# Patient Record
Sex: Male | Born: 1935 | Race: White | Hispanic: No | Smoking: Former smoker
Health system: Southern US, Community
[De-identification: ages and names within clinical notes are randomized; demographics above are authoritative.]

## PROBLEM LIST (undated history)

## (undated) DIAGNOSIS — Z973 Presence of spectacles and contact lenses: Secondary | ICD-10-CM

## (undated) DIAGNOSIS — M48061 Spinal stenosis, lumbar region without neurogenic claudication: Secondary | ICD-10-CM

## (undated) DIAGNOSIS — E875 Hyperkalemia: Secondary | ICD-10-CM

## (undated) DIAGNOSIS — M79603 Pain in arm, unspecified: Secondary | ICD-10-CM

## (undated) DIAGNOSIS — K449 Diaphragmatic hernia without obstruction or gangrene: Secondary | ICD-10-CM

## (undated) DIAGNOSIS — M549 Dorsalgia, unspecified: Secondary | ICD-10-CM

## (undated) DIAGNOSIS — I2510 Atherosclerotic heart disease of native coronary artery without angina pectoris: Secondary | ICD-10-CM

## (undated) DIAGNOSIS — Z8551 Personal history of malignant neoplasm of bladder: Secondary | ICD-10-CM

## (undated) DIAGNOSIS — G4700 Insomnia, unspecified: Secondary | ICD-10-CM

## (undated) DIAGNOSIS — K219 Gastro-esophageal reflux disease without esophagitis: Secondary | ICD-10-CM

## (undated) DIAGNOSIS — E785 Hyperlipidemia, unspecified: Secondary | ICD-10-CM

## (undated) DIAGNOSIS — M1990 Unspecified osteoarthritis, unspecified site: Secondary | ICD-10-CM

## (undated) DIAGNOSIS — R001 Bradycardia, unspecified: Secondary | ICD-10-CM

## (undated) DIAGNOSIS — M419 Scoliosis, unspecified: Secondary | ICD-10-CM

## (undated) DIAGNOSIS — I10 Essential (primary) hypertension: Secondary | ICD-10-CM

## (undated) DIAGNOSIS — R609 Edema, unspecified: Secondary | ICD-10-CM

## (undated) DIAGNOSIS — I700 Atherosclerosis of aorta: Secondary | ICD-10-CM

## (undated) DIAGNOSIS — R011 Cardiac murmur, unspecified: Secondary | ICD-10-CM

## (undated) DIAGNOSIS — M5136 Other intervertebral disc degeneration, lumbar region: Secondary | ICD-10-CM

## (undated) DIAGNOSIS — F419 Anxiety disorder, unspecified: Secondary | ICD-10-CM

## (undated) DIAGNOSIS — E876 Hypokalemia: Secondary | ICD-10-CM

## (undated) DIAGNOSIS — I4819 Other persistent atrial fibrillation: Secondary | ICD-10-CM

## (undated) DIAGNOSIS — G629 Polyneuropathy, unspecified: Secondary | ICD-10-CM

## (undated) DIAGNOSIS — M7070 Other bursitis of hip, unspecified hip: Secondary | ICD-10-CM

## (undated) DIAGNOSIS — Z955 Presence of coronary angioplasty implant and graft: Secondary | ICD-10-CM

## (undated) DIAGNOSIS — I6529 Occlusion and stenosis of unspecified carotid artery: Secondary | ICD-10-CM

## (undated) DIAGNOSIS — I340 Nonrheumatic mitral (valve) insufficiency: Secondary | ICD-10-CM

## (undated) HISTORY — DX: Unspecified osteoarthritis, unspecified site: M19.90

## (undated) HISTORY — PX: APPENDECTOMY: SHX54

## (undated) HISTORY — DX: Cardiac murmur, unspecified: R01.1

## (undated) HISTORY — PX: CORONARY ARTERY BYPASS GRAFT: SHX141

## (undated) HISTORY — PX: CAROTID STENT: SHX1301

## (undated) HISTORY — PX: COLONOSCOPY: SHX174

---
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
## 2004-08-19 DIAGNOSIS — I219 Acute myocardial infarction, unspecified: Secondary | ICD-10-CM

## 2004-08-19 HISTORY — PX: CORONARY ANGIOPLASTY WITH STENT PLACEMENT: SHX49

## 2004-08-19 HISTORY — DX: Acute myocardial infarction, unspecified: I21.9

## 2010-08-19 HISTORY — PX: CARDIAC CATHETERIZATION: SHX172

## 2010-08-19 HISTORY — PX: CORONARY ANGIOPLASTY: SHX604

## 2010-12-25 DIAGNOSIS — I2510 Atherosclerotic heart disease of native coronary artery without angina pectoris: Secondary | ICD-10-CM

## 2010-12-25 HISTORY — DX: Atherosclerotic heart disease of native coronary artery without angina pectoris: I25.10

## 2011-01-22 DIAGNOSIS — R001 Bradycardia, unspecified: Secondary | ICD-10-CM | POA: Insufficient documentation

## 2011-01-22 HISTORY — DX: Bradycardia, unspecified: R00.1

## 2011-05-08 DIAGNOSIS — I6529 Occlusion and stenosis of unspecified carotid artery: Secondary | ICD-10-CM

## 2011-05-08 DIAGNOSIS — Z955 Presence of coronary angioplasty implant and graft: Secondary | ICD-10-CM | POA: Insufficient documentation

## 2011-05-08 HISTORY — DX: Occlusion and stenosis of unspecified carotid artery: I65.29

## 2011-05-08 HISTORY — DX: Presence of coronary angioplasty implant and graft: Z95.5

## 2012-04-28 DIAGNOSIS — G4700 Insomnia, unspecified: Secondary | ICD-10-CM

## 2012-04-28 DIAGNOSIS — M549 Dorsalgia, unspecified: Secondary | ICD-10-CM

## 2012-04-28 DIAGNOSIS — G629 Polyneuropathy, unspecified: Secondary | ICD-10-CM | POA: Insufficient documentation

## 2012-04-28 HISTORY — DX: Polyneuropathy, unspecified: G62.9

## 2012-04-28 HISTORY — DX: Dorsalgia, unspecified: M54.9

## 2012-04-28 HISTORY — DX: Insomnia, unspecified: G47.00

## 2013-03-23 DIAGNOSIS — S27809A Unspecified injury of diaphragm, initial encounter: Secondary | ICD-10-CM

## 2013-03-23 DIAGNOSIS — M79603 Pain in arm, unspecified: Secondary | ICD-10-CM

## 2013-03-23 DIAGNOSIS — K449 Diaphragmatic hernia without obstruction or gangrene: Secondary | ICD-10-CM | POA: Insufficient documentation

## 2013-03-23 HISTORY — DX: Pain in arm, unspecified: M79.603

## 2013-03-23 HISTORY — DX: Unspecified injury of diaphragm, initial encounter: S27.809A

## 2013-05-04 DIAGNOSIS — I340 Nonrheumatic mitral (valve) insufficiency: Secondary | ICD-10-CM

## 2013-05-04 HISTORY — DX: Nonrheumatic mitral (valve) insufficiency: I34.0

## 2013-09-28 DIAGNOSIS — E876 Hypokalemia: Secondary | ICD-10-CM | POA: Insufficient documentation

## 2013-09-28 HISTORY — DX: Hypokalemia: E87.6

## 2013-10-18 HISTORY — PX: HERNIA REPAIR: SHX51

## 2015-09-29 DIAGNOSIS — Z8551 Personal history of malignant neoplasm of bladder: Secondary | ICD-10-CM

## 2015-09-29 HISTORY — DX: Personal history of malignant neoplasm of bladder: Z85.51

## 2017-06-09 DIAGNOSIS — R609 Edema, unspecified: Secondary | ICD-10-CM | POA: Insufficient documentation

## 2017-06-09 HISTORY — DX: Edema, unspecified: R60.9

## 2019-06-01 DIAGNOSIS — M7070 Other bursitis of hip, unspecified hip: Secondary | ICD-10-CM

## 2019-06-01 HISTORY — DX: Other bursitis of hip, unspecified hip: M70.70

## 2019-06-03 ENCOUNTER — Other Ambulatory Visit: Payer: Self-pay | Admitting: Orthopedic Surgery

## 2019-06-03 DIAGNOSIS — M7072 Other bursitis of hip, left hip: Secondary | ICD-10-CM

## 2019-06-08 ENCOUNTER — Telehealth: Payer: Self-pay | Admitting: Nurse Practitioner

## 2019-06-08 NOTE — Telephone Encounter (Signed)
Phone call to patient to verify medication list and allergies for myelogram procedure, spoke with wife. Informed her he will not need to hold any medications for this procedure.  Pre and post procedure instructions reviewed with wife, she verbalized understanding.

## 2019-06-09 ENCOUNTER — Ambulatory Visit
Admission: RE | Admit: 2019-06-09 | Discharge: 2019-06-09 | Disposition: A | Discharge status: Home / Self Care | Payer: Medicare HMO | Source: Ambulatory Visit | Attending: Orthopedic Surgery | Admitting: Orthopedic Surgery

## 2019-06-09 ENCOUNTER — Other Ambulatory Visit: Payer: Self-pay | Admitting: Orthopedic Surgery

## 2019-06-09 DIAGNOSIS — M7072 Other bursitis of hip, left hip: Secondary | ICD-10-CM

## 2019-06-09 DIAGNOSIS — I77811 Abdominal aortic ectasia: Secondary | ICD-10-CM

## 2019-06-09 DIAGNOSIS — M48061 Spinal stenosis, lumbar region without neurogenic claudication: Secondary | ICD-10-CM

## 2019-06-09 HISTORY — DX: Abdominal aortic ectasia: I77.811

## 2019-06-09 MED ORDER — IOPAMIDOL (ISOVUE-M 200) INJECTION 41%
15.0000 mL | Freq: Once | INTRAMUSCULAR | Status: AC
  Administered 2019-06-09: 15 mL via INTRATHECAL

## 2019-06-09 MED ORDER — MEPERIDINE HCL 100 MG/ML IJ SOLN
50.0000 mg | Freq: Once | INTRAMUSCULAR | Status: AC
  Administered 2019-06-09: 50 mg via INTRAMUSCULAR

## 2019-06-09 MED ORDER — ONDANSETRON HCL 4 MG/2ML IJ SOLN
4.0000 mg | Freq: Once | INTRAMUSCULAR | Status: AC
  Administered 2019-06-09: 4 mg via INTRAMUSCULAR

## 2019-06-09 NOTE — Discharge Instructions (Signed)

## 2019-06-10 ENCOUNTER — Other Ambulatory Visit: Payer: Self-pay

## 2019-06-16 ENCOUNTER — Encounter: Payer: Self-pay | Admitting: *Deleted

## 2019-06-16 ENCOUNTER — Other Ambulatory Visit: Payer: Self-pay

## 2019-06-16 ENCOUNTER — Other Ambulatory Visit: Payer: Self-pay | Admitting: *Deleted

## 2019-06-16 ENCOUNTER — Ambulatory Visit (INDEPENDENT_AMBULATORY_CARE_PROVIDER_SITE_OTHER): Payer: Medicare HMO | Admitting: Cardiology

## 2019-06-16 ENCOUNTER — Telehealth (HOSPITAL_COMMUNITY): Payer: Self-pay

## 2019-06-16 VITALS — BP 118/60 | HR 78 | Ht 69.0 in | Wt 163.0 lb

## 2019-06-16 DIAGNOSIS — E785 Hyperlipidemia, unspecified: Secondary | ICD-10-CM

## 2019-06-16 DIAGNOSIS — K219 Gastro-esophageal reflux disease without esophagitis: Secondary | ICD-10-CM | POA: Insufficient documentation

## 2019-06-16 DIAGNOSIS — I2510 Atherosclerotic heart disease of native coronary artery without angina pectoris: Secondary | ICD-10-CM | POA: Diagnosis not present

## 2019-06-16 DIAGNOSIS — I10 Essential (primary) hypertension: Secondary | ICD-10-CM | POA: Diagnosis not present

## 2019-06-16 DIAGNOSIS — I4510 Unspecified right bundle-branch block: Secondary | ICD-10-CM

## 2019-06-16 DIAGNOSIS — Z01810 Encounter for preprocedural cardiovascular examination: Secondary | ICD-10-CM | POA: Insufficient documentation

## 2019-06-16 DIAGNOSIS — E782 Mixed hyperlipidemia: Secondary | ICD-10-CM

## 2019-06-16 DIAGNOSIS — I4819 Other persistent atrial fibrillation: Secondary | ICD-10-CM

## 2019-06-16 HISTORY — DX: Unspecified right bundle-branch block: I45.10

## 2019-06-16 HISTORY — DX: Hyperlipidemia, unspecified: E78.5

## 2019-06-16 HISTORY — DX: Gastro-esophageal reflux disease without esophagitis: K21.9

## 2019-06-16 HISTORY — DX: Essential (primary) hypertension: I10

## 2019-06-16 NOTE — Telephone Encounter (Signed)
Attempted to reach the patient with instructions for his test tomorrow. Instructions left on the patient's answering machine. Asked to call back with any questions. S.Phoebe Marter EMTP

## 2019-06-16 NOTE — Progress Notes (Addendum)
Cardiology Office Note:    Date:  06/16/2019   ID:  Richard Bowen, DOB [birth year], MRN [id number]  PCP:  Patient, No Pcp Per  Cardiologist:  Garwin Brothersajan R Undine Nealis, MD   Referring MD: No ref. provider found    ASSESSMENT:    1. Preoperative cardiovascular examination   2. Mixed hyperlipidemia   3. Essential hypertension   4. Coronary artery disease involving native coronary artery of native heart without angina pectoris   5. Persistent atrial fibrillation (HCC)    PLAN:    In order of problems listed above:  1. Preoperative cardiovascular examination in the stratification: I discussed my findings with the patient at extensive length and secondary prevention stressed.  Importance of compliance with diet and medication stressed and he vocalized understanding. 2. Essential hypertension: Blood pressure is stable 3. Mixed dyslipidemia: Diet was discussed and lipids are followed by his primary care physician. 4. Atrial fibrillation: Patient mentions to me that he has been told that his heart rate is irregular.  His heart rate is well controlled.  In view of his unstable gait and impending spinal surgery I would not recommend anticoagulation at this time as the risks outweigh the benefits.  I discussed this with him at length and questions were answered to satisfaction. 5. In view of the above patient will undergo Lexiscan sestamibi.  If this test is negative then he is not at high risk for coronary events during the aforementioned surgery.  Meticulous hemodynamic monitoring will further reduce the risk of coronary events.  Echocardiogram will be done to assess murmur heard on auscultation.Patient will be seen in follow-up appointment in 2 months or earlier if the patient has any concerns.  Addendum: Results of stress test and echocardiogram are now available.  Stress test is abnormal but did not reveal any evidence of ischemia.  In view of review of these tests it will be appropriate to deem this  patient has moderate risk for intermediate risk for coronary events.  Meticulous hemodynamic monitoring will further reduce the risk of coronary events.  Please do not hesitate to communicate with us if there are any questions in his cardiovascular management. Signed Dr. Glean Hessajan Juley Giovanetti 06/18/2019    Medication Adjustments/Labs and Tests Ordered: Current medicines are reviewed at length with the patient today.  Concerns regarding medicines are outlined above.  Orders Placed This Encounter  Procedures  . EKG 12-Lead   No orders of the defined types were placed in this encounter.    History of Present Illness:    Richard Bowen is a 85 y.o. male who is being seen today for the evaluation of preoperative cardiovascular evaluation at the request of orthopedic surgery.  Patient is a pleasant 85 year old male.  He is a poor historian.  He tells me that he had coronary stenting more than 10 years ago.  He does not remember any details.  He has history of essential hypertension and dyslipidemia.  He is contemplating orthopedic surgery and is ambulatory.  He has significant issues and nerve compression-like symptoms for which surgery is deemed necessary.  He denies any chest pain orthopnea or PND.  At the time of my evaluation, the patient is alert awake oriented and in no distress.  Past Medical History:  Diagnosis Date  . Arm pain 03/23/2013  . Arthritis   . Back pain 04/28/2012  . Carotid atherosclerosis 05/08/2011  . Coronary artery disease with history of myocardial infarction without history of CABG 12/25/2010   Coronary Artery Disease  .  GERD (gastroesophageal reflux disease) 06/16/2019  . Heart murmur   . History of bladder cancer 09/29/2015  . Hyperlipidemia 06/16/2019  . Hypertension 06/16/2019  . Hypokalemia 09/28/2013  . Insomnia 04/28/2012  . Ischial bursitis 06/01/2019  . Mitral insufficiency 05/04/2013  . Neuropathy 04/28/2012  . Peripheral edema 06/09/2017  . Rupture of diaphragm  03/23/2013  . S/P coronary artery stent placement 05/08/2011  . Sinus bradycardia 01/22/2011     Current Medications: Current Meds  Medication Sig  . ALPRAZolam (XANAX) 0.5 MG tablet Take 0.5 mg by mouth at bedtime as needed for anxiety.  Marland Kitchen aspirin 81 MG chewable tablet Chew by mouth daily.  . benazepril (LOTENSIN) 20 MG tablet Take 20 mg by mouth daily.  . furosemide (LASIX) 20 MG tablet Take 20 mg by mouth daily.  . potassium chloride (KLOR-CON) 10 MEQ tablet Take 10 mEq by mouth 2 (two) times daily.  . pravastatin (PRAVACHOL) 20 MG tablet Take 20 mg by mouth daily.     Allergies:   Rosuvastatin   Social History   Socioeconomic History  . Marital status: Unknown    Spouse name: Not on file  . Number of children: Not on file  . Years of education: Not on file  . Highest education level: Not on file  Occupational History  . Not on file  Social Needs  . Financial resource strain: Not on file  . Food insecurity    Worry: Not on file    Inability: Not on file  . Transportation needs    Medical: Not on file    Non-medical: Not on file  Tobacco Use  . Smoking status: Former Smoker    Quit date: 07/09/1965    Years since quitting: 53.9  . Smokeless tobacco: Never Used  Substance and Sexual Activity  . Alcohol use: Not Currently  . Drug use: Not on file  . Sexual activity: Not on file  Lifestyle  . Physical activity    Days per week: Not on file    Minutes per session: Not on file  . Stress: Not on file  Relationships  . Social Musician on phone: Not on file    Gets together: Not on file    Attends religious service: Not on file    Active member of club or organization: Not on file    Attends meetings of clubs or organizations: Not on file    Relationship status: Not on file  Other Topics Concern  . Not on file  Social History Narrative  . Not on file     Family History: The patient's family history includes Cancer in his mother; Diabetes in his  father; Early death in his father, mother, and sister; Stroke in his father.  ROS:   Please see the history of present illness.    All other systems reviewed and are negative.  EKGs/Labs/Other Studies Reviewed:    The following studies were reviewed today: EKG revealed atrial fibrillation with well-controlled ventricular rate   Recent Labs: No results found for requested labs within last 8760 hours.  Recent Lipid Panel No results found for: CHOL, TRIG, HDL, CHOLHDL, VLDL, LDLCALC, LDLDIRECT  Physical Exam:    VS:  BP 118/60 (BP Location: Left Arm, Patient Position: Sitting, Cuff Size: Normal)   Pulse 78   Ht 5\' 9"  (1.753 m)   Wt 163 lb (73.9 kg)   SpO2 97%   BMI 24.07 kg/m     Wt Readings from Last 3  Encounters:  06/16/19 163 lb (73.9 kg)     GEN: Patient is in no acute distress HEENT: Normal NECK: No JVD; No carotid bruits LYMPHATICS: No lymphadenopathy CARDIAC: S1 S2 regular, 2/6 systolic murmur at the apex. RESPIRATORY:  Clear to auscultation without rales, wheezing or rhonchi  ABDOMEN: Soft, non-tender, non-distended MUSCULOSKELETAL:  No edema; No deformity  SKIN: Warm and dry NEUROLOGIC:  Alert and oriented x 3 PSYCHIATRIC:  Normal affect    Signed, Jenean Lindau, MD  06/16/2019 9:38 AM    [facility]

## 2019-06-16 NOTE — Addendum Note (Signed)
Addended by: Beckey Rutter on: 06/16/2019 09:46 AM   Modules accepted: Orders

## 2019-06-16 NOTE — Patient Instructions (Signed)
Medication Instructions:  Your physician recommends that you continue on your current medications as directed. Please refer to the Current Medication list given to you today.  If you need a refill on your cardiac medications before your next appointment, please call your pharmacy.   Lab work: NONE If you have labs (blood work) drawn today and your tests are completely normal, you will receive your results only by: . MyChart Message (if you have MyChart) OR . A paper copy in the mail If you have any lab test that is abnormal or we need to change your treatment, we will call you to review the results.  Testing/Procedures: You had an EKG performed today.  Your physician has requested that you have an echocardiogram. Echocardiography is a painless test that uses sound waves to create images of your heart. It provides your doctor with information about the size and shape of your heart and how well your heart's chambers and valves are working. This procedure takes approximately one hour. There are no restrictions for this procedure.  Your physician has requested that you have a lexiscan myoview. For further information please visit [email or web address]. Please follow instruction sheet, as given.    Follow-Up: At [facility], you and your health needs are our priority.  As part of our continuing mission to provide you with exceptional heart care, we have created designated Provider Care Teams.  These Care Teams include your primary Cardiologist (physician) and Advanced Practice Providers (APPs -  Physician Assistants and Nurse Practitioners) who all work together to provide you with the care you need, when you need it. You will need a follow up appointment in 4 months.   Any Other Special Instructions Will Be Listed Below  Regadenoson injection What is this medicine? REGADENOSON is used to test the heart for coronary artery disease. It is used in patients who can not exercise for their stress  test. This medicine may be used for other purposes; ask your health care provider or pharmacist if you have questions. COMMON BRAND NAME(S): Lexiscan What should I tell my health care provider before I take this medicine? They need to know if you have any of these conditions:  heart problems  lung or breathing disease, like asthma or COPD  an unusual or allergic reaction to regadenoson, other medicines, foods, dyes, or preservatives  pregnant or trying to get pregnant  breast-feeding How should I use this medicine? This medicine is for injection into a vein. It is given by a health care professional in a hospital or clinic setting. Talk to your pediatrician regarding the use of this medicine in children. Special care may be needed. Overdosage: If you think you have taken too much of this medicine contact a poison control center or emergency room at once. NOTE: This medicine is only for you. Do not share this medicine with others. What if I miss a dose? This does not apply. What may interact with this medicine?  caffeine  dipyridamole  guarana  theophylline This list may not describe all possible interactions. Give your health care provider a list of all the medicines, herbs, non-prescription drugs, or dietary supplements you use. Also tell them if you smoke, drink alcohol, or use illegal drugs. Some items may interact with your medicine. What should I watch for while using this medicine? Your condition will be monitored carefully while you are receiving this medicine. Do not take medicines, foods, or drinks with caffeine (like coffee, tea, or colas) for at least 12 hours   before your test. If you do not know if something contains caffeine, ask your health care professional. What side effects may I notice from receiving this medicine? Side effects that you should report to your doctor or health care professional as soon as possible:  allergic reactions like skin rash, itching or  hives, swelling of the face, lips, or tongue  breathing problems  chest pain, tightness or palpitations  severe headache Side effects that usually do not require medical attention (report to your doctor or health care professional if they continue or are bothersome):  flushing  headache  irritation or pain at site where injected  nausea, vomiting This list may not describe all possible side effects. Call your doctor for medical advice about side effects. You may report side effects to FDA at 1-800-FDA-1088. Where should I keep my medicine? This drug is given in a hospital or clinic and will not be stored at home. NOTE: This sheet is a summary. It may not cover all possible information. If you have questions about this medicine, talk to your doctor, pharmacist, or health care provider.  2020 Elsevier/Gold Standard (2008-04-04 15:08:13)  Cardiac Nuclear Scan A cardiac nuclear scan is a test that is done to check the flow of blood to your heart. It is done when you are resting and when you are exercising. The test looks for problems such as:  Not enough blood reaching a portion of the heart.  The heart muscle not working as it should. You may need this test if:  You have heart disease.  You have had lab results that are not normal.  You have had heart surgery or a balloon procedure to open up blocked arteries (angioplasty).  You have chest pain.  You have shortness of breath. In this test, a special dye (tracer) is put into your bloodstream. The tracer will travel to your heart. A camera will then take pictures of your heart to see how the tracer moves through your heart. This test is usually done at a hospital and takes 2-4 hours. Tell a doctor about:  Any allergies you have.  All medicines you are taking, including vitamins, herbs, eye drops, creams, and over-the-counter medicines.  Any problems you or family members have had with anesthetic medicines.  Any blood  disorders you have.  Any surgeries you have had.  Any medical conditions you have.  Whether you are pregnant or may be pregnant. What are the risks? Generally, this is a safe test. However, problems may occur, such as:  Serious chest pain and heart attack. This is only a risk if the stress portion of the test is done.  Rapid heartbeat.  A feeling of warmth in your chest. This feeling usually does not last long.  Allergic reaction to the tracer. What happens before the test?  Ask your doctor about changing or stopping your normal medicines. This is important.  Follow instructions from your doctor about what you cannot eat or drink.  Remove your jewelry on the day of the test. What happens during the test?  An IV tube will be inserted into one of your veins.  Your doctor will give you a small amount of tracer through the IV tube.  You will wait for 20-40 minutes while the tracer moves through your bloodstream.  Your heart will be monitored with an electrocardiogram (ECG).  You will lie down on an exam table.  Pictures of your heart will be taken for about 15-20 minutes.  You may   also have a stress test. For this test, one of these things may be done: ? You will be asked to exercise on a treadmill or a stationary bike. ? You will be given medicines that will make your heart work harder. This is done if you are unable to exercise.  When blood flow to your heart has peaked, a tracer will again be given through the IV tube.  After 20-40 minutes, you will get back on the exam table. More pictures will be taken of your heart.  Depending on the tracer that is used, more pictures may need to be taken 3-4 hours later.  Your IV tube will be removed when the test is over. The test may vary among doctors and hospitals. What happens after the test?  Ask your doctor: ? Whether you can return to your normal schedule, including diet, activities, and medicines. ? Whether you should  drink more fluids. This will help to remove the tracer from your body. Drink enough fluid to keep your pee (urine) pale yellow.  Ask your doctor, or the department that is doing the test: ? When will my results be ready? ? How will I get my results? Summary  A cardiac nuclear scan is a test that is done to check the flow of blood to your heart.  Tell your doctor whether you are pregnant or may be pregnant.  Before the test, ask your doctor about changing or stopping your normal medicines. This is important.  Ask your doctor whether you can return to your normal activities. You may be asked to drink more fluids. This information is not intended to replace advice given to you by your health care provider. Make sure you discuss any questions you have with your health care provider. Document Released: 01/19/2018 Document Revised: 11/25/2018 Document Reviewed: 01/19/2018 Elsevier Patient Education  2020 Elsevier Inc.  Echocardiogram An echocardiogram is a procedure that uses painless sound waves (ultrasound) to produce an image of the heart. Images from an echocardiogram can provide important information about:  Signs of coronary artery disease (CAD).  Aneurysm detection. An aneurysm is a weak or damaged part of an artery wall that bulges out from the normal force of blood pumping through the body.  Heart size and shape. Changes in the size or shape of the heart can be associated with certain conditions, including heart failure, aneurysm, and CAD.  Heart muscle function.  Heart valve function.  Signs of a past heart attack.  Fluid buildup around the heart.  Thickening of the heart muscle.  A tumor or infectious growth around the heart valves. Tell a health care provider about:  Any allergies you have.  All medicines you are taking, including vitamins, herbs, eye drops, creams, and over-the-counter medicines.  Any blood disorders you have.  Any surgeries you have had.  Any  medical conditions you have.  Whether you are pregnant or may be pregnant. What are the risks? Generally, this is a safe procedure. However, problems may occur, including:  Allergic reaction to dye (contrast) that may be used during the procedure. What happens before the procedure? No specific preparation is needed. You may eat and drink normally. What happens during the procedure?   An IV tube may be inserted into one of your veins.  You may receive contrast through this tube. A contrast is an injection that improves the quality of the pictures from your heart.  A gel will be applied to your chest.  A wand-like tool (transducer)   will be moved over your chest. The gel will help to transmit the sound waves from the transducer.  The sound waves will harmlessly bounce off of your heart to allow the heart images to be captured in real-time motion. The images will be recorded on a computer. The procedure may vary among health care providers and hospitals. What happens after the procedure?  You may return to your normal, everyday life, including diet, activities, and medicines, unless your health care provider tells you not to do that. Summary  An echocardiogram is a procedure that uses painless sound waves (ultrasound) to produce an image of the heart.  Images from an echocardiogram can provide important information about the size and shape of your heart, heart muscle function, heart valve function, and fluid buildup around your heart.  You do not need to do anything to prepare before this procedure. You may eat and drink normally.  After the echocardiogram is completed, you may return to your normal, everyday life, unless your health care provider tells you not to do that. This information is not intended to replace advice given to you by your health care provider. Make sure you discuss any questions you have with your health care provider. Document Released: 08/02/2000 Document  Revised: 11/26/2018 Document Reviewed: 09/07/2016 Elsevier Patient Education  2020 Elsevier Inc.  

## 2019-06-17 ENCOUNTER — Ambulatory Visit (HOSPITAL_BASED_OUTPATIENT_CLINIC_OR_DEPARTMENT_OTHER)
Admission: RE | Admit: 2019-06-17 | Discharge: 2019-06-17 | Disposition: A | Discharge status: Home / Self Care | Payer: Medicare HMO | Source: Ambulatory Visit | Attending: Cardiology | Admitting: Cardiology

## 2019-06-17 ENCOUNTER — Ambulatory Visit (HOSPITAL_BASED_OUTPATIENT_CLINIC_OR_DEPARTMENT_OTHER): Payer: Medicare HMO

## 2019-06-17 VITALS — Ht 69.0 in | Wt 163.0 lb

## 2019-06-17 DIAGNOSIS — I5189 Other ill-defined heart diseases: Secondary | ICD-10-CM

## 2019-06-17 DIAGNOSIS — I4819 Other persistent atrial fibrillation: Secondary | ICD-10-CM | POA: Diagnosis not present

## 2019-06-17 DIAGNOSIS — Z01810 Encounter for preprocedural cardiovascular examination: Secondary | ICD-10-CM | POA: Diagnosis not present

## 2019-06-17 DIAGNOSIS — I2510 Atherosclerotic heart disease of native coronary artery without angina pectoris: Secondary | ICD-10-CM

## 2019-06-17 HISTORY — DX: Other ill-defined heart diseases: I51.89

## 2019-06-17 LAB — MYOCARDIAL PERFUSION IMAGING
LV dias vol: 119 mL (ref 62–150)
LV sys vol: 79 mL
Peak HR: 74 {beats}/min
Rest HR: 63 {beats}/min
SDS: 0
SRS: 12
SSS: 12
TID: 1.08

## 2019-06-17 MED ORDER — REGADENOSON 0.4 MG/5ML IV SOLN
0.4000 mg | Freq: Once | INTRAVENOUS | Status: AC
  Administered 2019-06-17: 0.4 mg via INTRAVENOUS

## 2019-06-17 MED ORDER — TECHNETIUM TC 99M TETROFOSMIN IV KIT
31.4000 | PACK | Freq: Once | INTRAVENOUS | Status: AC | PRN
  Administered 2019-06-17: 31.4 via INTRAVENOUS
  Filled 2019-06-17: qty 32

## 2019-06-17 MED ORDER — TECHNETIUM TC 99M TETROFOSMIN IV KIT
10.2000 | PACK | Freq: Once | INTRAVENOUS | Status: AC | PRN
  Administered 2019-06-17: 10.2 via INTRAVENOUS
  Filled 2019-06-17: qty 11

## 2019-06-17 NOTE — Progress Notes (Signed)
  Echocardiogram 2D Echocardiogram has been performed.  Matilde Bash 06/17/2019, 1:56 PM

## 2019-06-18 LAB — ECHOCARDIOGRAM COMPLETE
Height: 69 in
Weight: 2608 oz

## 2019-06-21 ENCOUNTER — Telehealth: Payer: Self-pay

## 2019-06-21 NOTE — Telephone Encounter (Signed)
-----   Message from Jenean Lindau, MD sent at 06/18/2019  2:23 PM EDT ----- The results of the study is unremarkable. Please inform patient. I will discuss in detail at next appointment. Cc  primary care/referring physician Jenean Lindau, MD 06/18/2019 2:23 PM

## 2019-06-21 NOTE — Telephone Encounter (Signed)
Left message for patient to call office for results. °

## 2019-06-23 NOTE — Telephone Encounter (Signed)
Results relayed, patient does not have a PCP. 

## 2019-06-26 ENCOUNTER — Other Ambulatory Visit (HOSPITAL_COMMUNITY)
Admission: RE | Admit: 2019-06-26 | Discharge: 2019-06-26 | Disposition: A | Discharge status: Home / Self Care | Payer: Medicare HMO | Source: Ambulatory Visit | Attending: Orthopedic Surgery | Admitting: Orthopedic Surgery

## 2019-06-26 DIAGNOSIS — Z20828 Contact with and (suspected) exposure to other viral communicable diseases: Secondary | ICD-10-CM | POA: Diagnosis not present

## 2019-06-26 DIAGNOSIS — Z01812 Encounter for preprocedural laboratory examination: Secondary | ICD-10-CM | POA: Insufficient documentation

## 2019-06-27 LAB — NOVEL CORONAVIRUS, NAA (HOSP ORDER, SEND-OUT TO REF LAB; TAT 18-24 HRS): SARS-CoV-2, NAA: NOT DETECTED

## 2019-06-29 ENCOUNTER — Encounter (HOSPITAL_COMMUNITY)
Admission: RE | Admit: 2019-06-29 | Discharge: 2019-06-29 | Disposition: A | Discharge status: Home / Self Care | Payer: Medicare HMO | Source: Ambulatory Visit | Attending: Orthopedic Surgery | Admitting: Orthopedic Surgery

## 2019-06-29 ENCOUNTER — Other Ambulatory Visit: Payer: Self-pay

## 2019-06-29 ENCOUNTER — Encounter (HOSPITAL_COMMUNITY): Payer: Self-pay

## 2019-06-29 ENCOUNTER — Encounter (HOSPITAL_COMMUNITY): Payer: Self-pay | Admitting: Physician Assistant

## 2019-06-29 ENCOUNTER — Ambulatory Visit (HOSPITAL_COMMUNITY)
Admission: RE | Admit: 2019-06-29 | Discharge: 2019-06-29 | Disposition: A | Discharge status: Home / Self Care | Payer: Medicare HMO | Source: Ambulatory Visit | Attending: Surgical | Admitting: Surgical

## 2019-06-29 DIAGNOSIS — I10 Essential (primary) hypertension: Secondary | ICD-10-CM | POA: Insufficient documentation

## 2019-06-29 DIAGNOSIS — I252 Old myocardial infarction: Secondary | ICD-10-CM | POA: Diagnosis not present

## 2019-06-29 DIAGNOSIS — Z955 Presence of coronary angioplasty implant and graft: Secondary | ICD-10-CM | POA: Insufficient documentation

## 2019-06-29 DIAGNOSIS — Z01818 Encounter for other preprocedural examination: Secondary | ICD-10-CM | POA: Insufficient documentation

## 2019-06-29 DIAGNOSIS — R531 Weakness: Secondary | ICD-10-CM | POA: Diagnosis not present

## 2019-06-29 DIAGNOSIS — K219 Gastro-esophageal reflux disease without esophagitis: Secondary | ICD-10-CM | POA: Insufficient documentation

## 2019-06-29 DIAGNOSIS — M48061 Spinal stenosis, lumbar region without neurogenic claudication: Secondary | ICD-10-CM | POA: Diagnosis not present

## 2019-06-29 DIAGNOSIS — M419 Scoliosis, unspecified: Secondary | ICD-10-CM | POA: Diagnosis not present

## 2019-06-29 DIAGNOSIS — Z8551 Personal history of malignant neoplasm of bladder: Secondary | ICD-10-CM | POA: Insufficient documentation

## 2019-06-29 DIAGNOSIS — Z79899 Other long term (current) drug therapy: Secondary | ICD-10-CM | POA: Diagnosis not present

## 2019-06-29 DIAGNOSIS — Z87891 Personal history of nicotine dependence: Secondary | ICD-10-CM | POA: Insufficient documentation

## 2019-06-29 DIAGNOSIS — M545 Low back pain, unspecified: Secondary | ICD-10-CM

## 2019-06-29 DIAGNOSIS — M21371 Foot drop, right foot: Secondary | ICD-10-CM | POA: Diagnosis not present

## 2019-06-29 DIAGNOSIS — E785 Hyperlipidemia, unspecified: Secondary | ICD-10-CM | POA: Diagnosis not present

## 2019-06-29 DIAGNOSIS — Z951 Presence of aortocoronary bypass graft: Secondary | ICD-10-CM | POA: Diagnosis not present

## 2019-06-29 DIAGNOSIS — I2510 Atherosclerotic heart disease of native coronary artery without angina pectoris: Secondary | ICD-10-CM | POA: Diagnosis not present

## 2019-06-29 DIAGNOSIS — Z7982 Long term (current) use of aspirin: Secondary | ICD-10-CM | POA: Insufficient documentation

## 2019-06-29 LAB — CBC WITH DIFFERENTIAL/PLATELET
Abs Immature Granulocytes: 0.04 10*3/uL (ref 0.00–0.07)
Basophils Absolute: 0.1 10*3/uL (ref 0.0–0.1)
Basophils Relative: 1 %
Eosinophils Absolute: 0.2 10*3/uL (ref 0.0–0.5)
Eosinophils Relative: 2 %
HCT: 37.4 % — ABNORMAL LOW (ref 39.0–52.0)
Hemoglobin: 11.8 g/dL — ABNORMAL LOW (ref 13.0–17.0)
Immature Granulocytes: 1 %
Lymphocytes Relative: 13 %
Lymphs Abs: 0.9 10*3/uL (ref 0.7–4.0)
MCH: 32.6 pg (ref 26.0–34.0)
MCHC: 31.6 g/dL (ref 30.0–36.0)
MCV: 103.3 fL — ABNORMAL HIGH (ref 80.0–100.0)
Monocytes Absolute: 0.5 10*3/uL (ref 0.1–1.0)
Monocytes Relative: 7 %
Neutro Abs: 5.5 10*3/uL (ref 1.7–7.7)
Neutrophils Relative %: 76 %
Platelets: 197 10*3/uL (ref 150–400)
RBC: 3.62 MIL/uL — ABNORMAL LOW (ref 4.22–5.81)
RDW: 15.2 % (ref 11.5–15.5)
WBC: 7.2 10*3/uL (ref 4.0–10.5)
nRBC: 0 % (ref 0.0–0.2)

## 2019-06-29 LAB — COMPREHENSIVE METABOLIC PANEL
ALT: 22 U/L (ref 0–44)
AST: 26 U/L (ref 15–41)
Albumin: 3.3 g/dL — ABNORMAL LOW (ref 3.5–5.0)
Alkaline Phosphatase: 50 U/L (ref 38–126)
Anion gap: 9 (ref 5–15)
BUN: 20 mg/dL (ref 8–23)
CO2: 36 mmol/L — ABNORMAL HIGH (ref 22–32)
Calcium: 8.7 mg/dL — ABNORMAL LOW (ref 8.9–10.3)
Chloride: 99 mmol/L (ref 98–111)
Creatinine, Ser: 1.35 mg/dL — ABNORMAL HIGH (ref 0.61–1.24)
GFR calc Af Amer: 55 mL/min — ABNORMAL LOW (ref >60)
GFR calc non Af Amer: 48 mL/min — ABNORMAL LOW (ref >60)
Glucose, Bld: 111 mg/dL — ABNORMAL HIGH (ref 70–99)
Potassium: 2.6 mmol/L — CL (ref 3.5–5.1)
Sodium: 144 mmol/L (ref 135–145)
Total Bilirubin: 1.5 mg/dL — ABNORMAL HIGH (ref 0.3–1.2)
Total Protein: 5.4 g/dL — ABNORMAL LOW (ref 6.5–8.1)

## 2019-06-29 LAB — PROTIME-INR
INR: 0.9 (ref 0.8–1.2)
Prothrombin Time: 12.4 seconds (ref 11.4–15.2)

## 2019-06-29 LAB — SURGICAL PCR SCREEN
MRSA, PCR: NEGATIVE
Staphylococcus aureus: POSITIVE — AB

## 2019-06-29 LAB — ABO/RH: ABO/RH(D): A POS

## 2019-06-29 LAB — APTT: aPTT: 25 seconds (ref 24–36)

## 2019-06-29 MED ORDER — BUPIVACAINE LIPOSOME 1.3 % IJ SUSP
20.0000 mL | Freq: Once | INTRAMUSCULAR | Status: DC
  Filled 2019-06-29: qty 20

## 2019-06-29 NOTE — H&P (Signed)
Richard Bowen is an 85 y.o. male.   Chief Complaint: low back pain HPI: The patient is a 85 year old male who presented to the office with the chief complaint of low back pain. He developed bilateral posterior thigh pain that did not improve with conservative measures including injection and oral corticosteroid use. CT myelogram showed severe spinal stenosis at L4-L5.   Past Medical History:  Diagnosis Date  . Arm pain 03/23/2013  . Arthritis   . Back pain 04/28/2012  . Carotid atherosclerosis 05/08/2011  . Coronary artery disease with history of myocardial infarction without history of CABG 12/25/2010   Coronary Artery Disease  . GERD (gastroesophageal reflux disease) 06/16/2019  . Heart murmur   . History of bladder cancer 09/29/2015  . Hyperlipidemia 06/16/2019  . Hypertension 06/16/2019  . Hypokalemia 09/28/2013  . Insomnia 04/28/2012  . Ischial bursitis 06/01/2019  . Mitral insufficiency 05/04/2013  . Neuropathy 04/28/2012  . Peripheral edema 06/09/2017  . Rupture of diaphragm 03/23/2013  . S/P coronary artery stent placement 05/08/2011  . Sinus bradycardia 01/22/2011    Past Surgical History:  Procedure Laterality Date  . APPENDECTOMY    . CARDIAC CATHETERIZATION  2012  . CAROTID STENT    . CORONARY ANGIOPLASTY  2012  . CORONARY ARTERY BYPASS GRAFT    . HERNIA REPAIR  10/18/2013    Family History  Problem Relation Age of Onset  . Cancer Mother   . Early death Mother   . Diabetes Father   . Stroke Father   . Early death Father   . Early death Sister    Social History:  reports that he quit smoking about 54 years ago. He has never used smokeless tobacco. He reports previous alcohol use. He reports that he does not use drugs.  Allergies:  Allergies  Allergen Reactions  . Rosuvastatin Other (See Comments)    Aches     Current Facility-Administered Medications:  .  [START ON 06/30/2019] bupivacaine liposome (EXPAREL) 1.3 % injection 266 mg, 20 mL, Infiltration, Once,  Ranee Gosselin, MD  Current Outpatient Medications:  .  ALPRAZolam (XANAX) 0.5 MG tablet, Take 0.5 mg by mouth at bedtime as needed for anxiety., Disp: , Rfl:  .  aspirin 81 MG chewable tablet, Chew 81 mg by mouth daily. , Disp: , Rfl:  .  benazepril (LOTENSIN) 20 MG tablet, Take 20 mg by mouth daily., Disp: , Rfl:  .  furosemide (LASIX) 20 MG tablet, Take 20 mg by mouth daily., Disp: , Rfl:  .  potassium chloride (KLOR-CON) 10 MEQ tablet, Take 10 mEq by mouth 2 (two) times daily., Disp: , Rfl:  .  pravastatin (PRAVACHOL) 20 MG tablet, Take 20 mg by mouth at bedtime. , Disp: , Rfl:   Results for orders placed or performed during the hospital encounter of 06/29/19 (from the past 48 hour(s))  Surgical pcr screen     Status: Abnormal   Collection Time: 06/29/19 10:59 AM   Specimen: Nasal Mucosa; Nasal Swab  Result Value Ref Range   MRSA, PCR NEGATIVE NEGATIVE   Staphylococcus aureus POSITIVE (A) NEGATIVE    Comment: (NOTE) The Xpert SA Assay (FDA approved for NASAL specimens in patients 34 years of age and older), is one component of a comprehensive surveillance program. It is not intended to diagnose infection nor to guide or monitor treatment. Performed at [facility], [address], [place], [place] [address]   ABO/Rh     Status: None   Collection  Time: 06/29/19 11:15 AM  Result Value Ref Range   ABO/RH(D)      A POS Performed at [facility], [address] [place], [place] [address]   APTT     Status: None   Collection Time: 06/29/19 11:29 AM  Result Value Ref Range   aPTT 25 24 - 36 seconds    Comment: Performed at [facility], [address] [place], [place] [address]  CBC WITH DIFFERENTIAL     Status: Abnormal   Collection Time: 06/29/19 11:29 AM  Result Value Ref Range   WBC 7.2 4.0 - 10.5 K/uL   RBC 3.62 (L) 4.22 - 5.81 MIL/uL   Hemoglobin 11.8 (L) 13.0 - 17.0 g/dL   HCT 37.4 (L) 39.0 - 52.0 %   MCV  103.3 (H) 80.0 - 100.0 fL   MCH 32.6 26.0 - 34.0 pg   MCHC 31.6 30.0 - 36.0 g/dL   RDW 15.2 11.5 - 15.5 %   Platelets 197 150 - 400 K/uL   nRBC 0.0 0.0 - 0.2 %   Neutrophils Relative % 76 %   Neutro Abs 5.5 1.7 - 7.7 K/uL   Lymphocytes Relative 13 %   Lymphs Abs 0.9 0.7 - 4.0 K/uL   Monocytes Relative 7 %   Monocytes Absolute 0.5 0.1 - 1.0 K/uL   Eosinophils Relative 2 %   Eosinophils Absolute 0.2 0.0 - 0.5 K/uL   Basophils Relative 1 %   Basophils Absolute 0.1 0.0 - 0.1 K/uL   Immature Granulocytes 1 %   Abs Immature Granulocytes 0.04 0.00 - 0.07 K/uL    Comment: Performed at [facility], [address] [place], [place] [address]  Comprehensive metabolic panel     Status: Abnormal   Collection Time: 06/29/19 11:29 AM  Result Value Ref Range   Sodium 144 135 - 145 mmol/L   Potassium 2.6 (LL) 3.5 - 5.1 mmol/L    Comment: CRITICAL RESULT CALLED TO, READ BACK BY AND VERIFIED WITH: STRENK,J. AC RN @1313 ON 11.10.2020 BY COHEN,K    Chloride 99 98 - 111 mmol/L   CO2 36 (H) 22 - 32 mmol/L   Glucose, Bld 111 (H) 70 - 99 mg/dL   BUN 20 8 - 23 mg/dL   Creatinine, Ser 1.35 (H) 0.61 - 1.24 mg/dL   Calcium 8.7 (L) 8.9 - 10.3 mg/dL   Total Protein 5.4 (L) 6.5 - 8.1 g/dL   Albumin 3.3 (L) 3.5 - 5.0 g/dL   AST 26 15 - 41 U/L   ALT 22 0 - 44 U/L   Alkaline Phosphatase 50 38 - 126 U/L   Total Bilirubin 1.5 (H) 0.3 - 1.2 mg/dL   GFR calc non Af Amer 48 (L) >60 mL/min   GFR calc Af Amer 55 (L) >60 mL/min   Anion gap 9 5 - 15    Comment: Performed at [facility], [address] [place], [place] [address]  Protime-INR     Status: None   Collection Time: 06/29/19 11:29 AM  Result Value Ref Range   Prothrombin Time 12.4 11.4 - 15.2 seconds   INR 0.9 0.8 - 1.2    Comment: (NOTE) INR goal varies based on device and disease states. Performed at  [facility], [address] [place], [place] [address]   Type and screen Order type  and screen if day of surgery is less than 15 days from draw of preadmission visit or order morning   of surgery if day of surgery is greater than 6 days from preadmission visit.     Status: None   Collection Time: 06/29/19 11:29 AM  Result Value Ref Range   ABO/RH(D) A POS    Antibody Screen NEG    Sample Expiration 07/13/2019,2359    Extend sample reason      NO TRANSFUSIONS OR PREGNANCY IN THE PAST 3 MONTHS Performed at [facility], [place] [address], [place], [place] [address]    Dg Lumbar Spine 2-3 Views  Result Date: 06/29/2019 CLINICAL DATA:  Preop lumbar spine surgery. EXAM: LUMBAR SPINE - 2-3 VIEW COMPARISON:  None. FINDINGS: The lumbar spine levels have been annotated. Again seen is a scoliosis deformity which is convex towards the left. Multi level disc space narrowing and endplate spurring is identified. This is most advanced at L2-3, L3-4, and L5-S1. Aortic atherosclerosis. IMPRESSION: Lumbar scoliosis and degenerative disc disease. Electronically Signed   By: Kerby Moors M.D.   On: 06/29/2019 16:30   Vitals BP 140 / 80 Pulse 64 bpm regular   Review of Systems  Constitutional: Negative.   HENT: Negative.   Eyes: Negative.   Respiratory: Negative.   Cardiovascular: Negative.   Gastrointestinal: Negative.   Genitourinary: Negative.   Musculoskeletal: Positive for back pain, joint pain and myalgias. Negative for falls and neck pain.  Skin: Negative.   Neurological: Negative.   Endo/Heme/Allergies: Negative.   Psychiatric/Behavioral: Negative.    Physical Exam  Constitutional: He is oriented to person, place, and time. He appears well-developed and well-nourished. No distress.  HENT:  Head: Normocephalic and atraumatic.  Right Ear: External ear normal.  Left Ear: External ear normal.  Nose: Nose normal.  Mouth/Throat: Oropharynx is clear and moist.  Eyes: Conjunctivae and EOM are normal.  Neck: Normal range of motion. Neck supple.   Cardiovascular: Normal rate, regular rhythm and intact distal pulses.  Murmur heard.  Systolic murmur is present with a grade of 2/6. Respiratory: Effort normal and breath sounds normal. No respiratory distress. He has no wheezes.  GI: Soft. Bowel sounds are normal. He exhibits no distension. There is no abdominal tenderness.  Musculoskeletal:     Right hip: Normal.     Left hip: Normal.     Right knee: Normal.     Left knee: Normal.     Comments: Pain with motion of the lumbar spine with radiation of pain into the legs.   Neurological: He is alert and oriented to person, place, and time. He has normal strength. No sensory deficit.  Skin: No rash noted. He is not diaphoretic. No erythema.  Psychiatric: He has a normal mood and affect. His behavior is normal.     Assessment/Plan Lumbar spinal stenosis L4-L5 He needs a lumbar decompression L4-L5. Risks and benefits of the procedure discussed with the patient by Dr. Latanya Maudlin. Will stay overnight for observation.   H&P performed by Dr. Latanya Maudlin Documented by Ardeen Jourdain, PA-C  Maurine Mowbray Thomasenia Sales, PA-C 06/29/2019, 8:58 PM

## 2019-06-29 NOTE — Progress Notes (Addendum)
Anesthesia Chart Review   Case: [id number] Date/Time: 06/30/19 1015   Procedure: Lumbar decompression L4-L5 (N/A ) -   Anesthesia type: General   Pre-op diagnosis: spinal stenosis, bilateral leg weakness, right foot drop   Location: [facility] ROOM 08 / [facility]   Surgeon: Ranee Gosselin, MD      DISCUSSION:85 y.o. former smoker (quit 07/09/65) with h/o HTN, GERD, HLD, CAD (MI, CABG 12/25/2010, stent 05/08/2011), spinal stenosis, bilateral leg weakness, right foot drop scheduled for above procedure 06/30/2019 with Dr. Ranee Gosselin.   Last seen by cardiologist, Dr. Belva Crome, 06/16/2019.  Per OV note, "Results of stress test and echocardiogram are now available.  Stress test is abnormal but did not reveal any evidence of ischemia.  In view of review of these tests it will be appropriate to deem this patient has moderate risk for intermediate risk for coronary events.  Meticulous hemodynamic monitoring will further reduce the risk of coronary events.  Please do not hesitate to communicate with [place] if there are any questions in his cardiovascular management."  Critical lab of K 2.6 at PAT visit 06/29/2019.  Nurse informed Dr. Jeannetta Ellis office.  Discussed with Dr. Hart Rochester. Pt will come in early for repeat labs, order for BMP placed.     VS: BP (!) 148/73 (BP Location: Left Arm)   Pulse 75   Temp 36.8 C (Oral)   Resp 18   Ht 5\' 10"  (1.778 m)   Wt 75 kg   SpO2 99%   BMI 23.72 kg/m   PROVIDERS: Medicine, [facility]  Revankar, [facility], MD is Cardiologist  LABS: Critical lab value of 2.6, surgeon made aware (all labs ordered are listed, but only abnormal results are displayed)  Labs Reviewed  CBC WITH DIFFERENTIAL/PLATELET - Abnormal; Notable for the following components:      Result Value   RBC 3.62 (*)    Hemoglobin 11.8 (*)    HCT 37.4 (*)    MCV 103.3 (*)    All other components within normal limits  COMPREHENSIVE METABOLIC PANEL - Abnormal; Notable for the  following components:   Potassium 2.6 (*)    CO2 36 (*)    Glucose, Bld 111 (*)    Creatinine, Ser 1.35 (*)    Calcium 8.7 (*)    Total Protein 5.4 (*)    Albumin 3.3 (*)    Total Bilirubin 1.5 (*)    GFR calc non Af Amer 48 (*)    GFR calc Af Amer 55 (*)    All other components within normal limits  SURGICAL PCR SCREEN  APTT  PROTIME-INR  TYPE AND SCREEN  ABO/RH     IMAGES:   EKG: 06/16/2019 Rate 65 bpm  Atrial fibrillation with premature ventricular or aberrantly conducted complexes Incomplete right bundle branch block  ST & T wave abnormality, consider lateral ischemia   CV: Echo 06/17/2019 IMPRESSIONS   1. Left ventricular ejection fraction, by visual estimation, is 50 to 55%. The left ventricle has normal function. There is mildly increased left ventricular hypertrophy.  2. Left ventricular diastolic parameters are consistent with Grade III diastolic dysfunction (restrictive).  3. Global right ventricle has normal systolic function.The right ventricular size is normal. No increase in right ventricular wall thickness.  4. The aortic valve is normal in structure. Aortic valve regurgitation is mild. No evidence of aortic valve sclerosis or stenosis.  Myocardial Perfusion 06/17/2019  Nuclear stress EF: 34%.  There was no ST segment deviation noted during stress.  Defect 1: There is a large defect of severe severity present in the basal inferolateral and mid inferolateral location.  Findings consistent with prior myocardial infarction.  This is an intermediate risk study.  The left ventricular ejection fraction is moderately decreased (30-44%).   Abnormal, intermediate risk stress nuclear study with large prior inferior lateral infarct.  No ischemia.  Gated ejection fraction 34% with akinesis of the inferolateral wall.  Mild left ventricular enlargement.  Study interpreted as intermediate risk due to reduced LV function. Past Medical History:  Diagnosis Date   . Arm pain 03/23/2013  . Arthritis   . Back pain 04/28/2012  . Carotid atherosclerosis 05/08/2011  . Coronary artery disease with history of myocardial infarction without history of CABG 12/25/2010   Coronary Artery Disease  . GERD (gastroesophageal reflux disease) 06/16/2019  . Heart murmur   . History of bladder cancer 09/29/2015  . Hyperlipidemia 06/16/2019  . Hypertension 06/16/2019  . Hypokalemia 09/28/2013  . Insomnia 04/28/2012  . Ischial bursitis 06/01/2019  . Mitral insufficiency 05/04/2013  . Neuropathy 04/28/2012  . Peripheral edema 06/09/2017  . Rupture of diaphragm 03/23/2013  . S/P coronary artery stent placement 05/08/2011  . Sinus bradycardia 01/22/2011    Past Surgical History:  Procedure Laterality Date  . APPENDECTOMY    . CARDIAC CATHETERIZATION  2012  . CAROTID STENT    . CORONARY ANGIOPLASTY  2012  . CORONARY ARTERY BYPASS GRAFT    . HERNIA REPAIR  10/18/2013    MEDICATIONS: . ALPRAZolam (XANAX) 0.5 MG tablet  . aspirin 81 MG chewable tablet  . benazepril (LOTENSIN) 20 MG tablet  . furosemide (LASIX) 20 MG tablet  . potassium chloride (KLOR-CON) 10 MEQ tablet  . pravastatin (PRAVACHOL) 20 MG tablet   No current facility-administered medications for this encounter.    Derrill Memo ON 06/30/2019] bupivacaine liposome (EXPAREL) 1.3 % injection 266 mg   Maia Plan [facility] Pre-Surgical Testing [phone] 06/29/19  1:35 PM

## 2019-06-29 NOTE — Anesthesia Preprocedure Evaluation (Deleted)
Anesthesia Evaluation    Reviewed: Allergy & Precautions, Patient's Chart, lab work & pertinent test results  History of Anesthesia Complications Negative for: history of anesthetic complications  Airway        Dental   Pulmonary neg pulmonary ROS, former smoker,           Cardiovascular hypertension, + CAD, + Past MI, + Cardiac Stents (2012), + CABG (2012) and +CHF  + dysrhythmias Atrial Fibrillation      Neuro/Psych Lumbar spinal stenosis with myelopathy negative psych ROS   GI/Hepatic Neg liver ROS, GERD  ,  Endo/Other  negative endocrine ROS  Renal/GU negative Renal ROS  negative genitourinary   Musculoskeletal negative musculoskeletal ROS (+)   Abdominal   Peds  Hematology negative hematology ROS (+)   Anesthesia Other Findings Last seen by cardiologist, Dr. Jyl Heinz, 06/16/2019.  Per OV note, "Results of stress test and echocardiogram are now available.  Stress test is abnormal but did not reveal any evidence of ischemia.  In view of review of these tests it will be appropriate to deem this patient has moderate risk for intermediate risk for coronary events.  Meticulous hemodynamic monitoring will further reduce the risk of coronary events.  Please do not hesitate to communicate with [place] if there are any questions in his cardiovascular management."  Echo 06/17/19: EF 50-55%, grade III dd, mild AI  MPS 06/17/19: Abnormal, intermediate risk stress nuclear study with large prior inferior lateral infarct.  No ischemia.  Gated ejection fraction 34% with akinesis of the inferolateral wall.  Mild left ventricular enlargement.  Study interpreted as intermediate risk due to reduced LV function.  Reproductive/Obstetrics                            Anesthesia Physical Anesthesia Plan  ASA:   Anesthesia Plan:    Post-op Pain Management:    Induction:   PONV Risk Score and Plan:   Airway  Management Planned:   Additional Equipment:   Intra-op Plan:   Post-operative Plan:   Informed Consent:   Plan Discussed with:   Anesthesia Plan Comments: (Case cancelled due to significant hypokalemia)      Anesthesia Quick Evaluation

## 2019-06-29 NOTE — Progress Notes (Signed)
CRITICAL VALUE ALERT  Critical Value:  Potassium-2.6  Date & Time Notied:  06/29/2019 130 pm from [facility]  Provider Notified: 06/29/2019 spoke with Brunilda Payor, Tamora at Dr. Charlestine Night office and she will give message to [employer]  Orders Received/Actions taken: Lynelle Smoke will give message to Amber for orders

## 2019-06-29 NOTE — Patient Instructions (Addendum)
DUE TO COVID-19 ONLY ONE VISITOR IS ALLOWED TO COME WITH YOU AND STAY IN THE WAITING ROOM ONLY DURING PRE OP AND PROCEDURE DAY OF SURGERY. THE 1 VISITOR MAY VISIT WITH YOU AFTER SURGERY IN YOUR PRIVATE ROOM DURING VISITING HOURS ONLY!   ONCE YOUR COVID TEST IS COMPLETED, PLEASE BEGIN THE QUARANTINE INSTRUCTIONS AS OUTLINED IN YOUR HANDOUT.                Arlana LindauAllen Engelbrecht     Your procedure is scheduled on: Wednesday 06/30/2019   Report to [facility] Main  Entrance    Report to admitting at 0830  AM     Call this number if you have problems the morning of surgery [phone]    Remember: Do not eat food  :After Midnight.    NO SOLID FOOD AFTER MIDNIGHT THE NIGHT PRIOR TO SURGERY. NOTHING BY MOUTH EXCEPT CLEAR LIQUIDS UNTIL  0730  am .   PLEASE FINISH ENSURE DRINK PER SURGEON ORDER  WHICH NEEDS TO BE COMPLETED AT 0730   am.   CLEAR LIQUID DIET   Foods Allowed                                                                     Foods Excluded  Coffee and tea, regular and decaf                             liquids that you cannot  Plain Jell-O any favor except red or purple                                           see through such as: Fruit ices (not with fruit pulp)                                     milk, soups, orange juice  Iced Popsicles                                    All solid food Carbonated beverages, regular and diet                                    Cranberry, grape and apple juices Sports drinks like Gatorade Lightly seasoned clear broth or consume(fat free) Sugar, honey syrup  Sample Menu Breakfast                                Lunch                                     Supper Cranberry juice                    Beef broth  Chicken broth Jell-O                                     Grape juice                           Apple juice Coffee or tea                        Jell-O                                      Popsicle                                                 Coffee or tea                        Coffee or tea  _____________________________________________________________________     BRUSH YOUR TEETH MORNING OF SURGERY AND RINSE YOUR MOUTH OUT, NO CHEWING GUM CANDY OR MINTS.     Take these medicines the morning of surgery with A SIP OF WATER: none                                You may not have any metal on your body including hair pins and              piercings  Do not wear jewelry, make-up, lotions, powders or perfumes, deodorant                         Men may shave face and neck.   Do not bring valuables to the hospital. [place] IS NOT             RESPONSIBLE   FOR VALUABLES.  Contacts, dentures or bridgework may not be worn into surgery.  Leave suitcase in the car. After surgery it may be brought to your room.                   Please read over the following fact sheets you were given: _____________________________________________________________________             [facility] - Preparing for Surgery Before surgery, you can play an important role.  Because skin is not sterile, your skin needs to be as free of germs as possible.  You can reduce the number of germs on your skin by washing with CHG (chlorahexidine gluconate) soap before surgery.  CHG is an antiseptic cleaner which kills germs and bonds with the skin to continue killing germs even after washing. Please DO NOT use if you have an allergy to CHG or antibacterial soaps.  If your skin becomes reddened/irritated stop using the CHG and inform your nurse when you arrive at Short Stay. Do not shave (including legs and underarms) for at least 48 hours prior to the first CHG shower.  You may shave your face/neck. Please follow these instructions carefully:  1.  Shower with CHG Soap the night before surgery and the  morning of Surgery.  2.  If you choose to wash your hair, wash your hair first as usual with your  normal  shampoo.  3.   After you shampoo, rinse your hair and body thoroughly to remove the  shampoo.                           4.  Use CHG as you would any other liquid soap.  You can apply chg directly  to the skin and wash                       Gently with a scrungie or clean washcloth.  5.  Apply the CHG Soap to your body ONLY FROM THE NECK DOWN.   Do not use on face/ open                           Wound or open sores. Avoid contact with eyes, ears mouth and genitals (private parts).                       Wash face,  Genitals (private parts) with your normal soap.             6.  Wash thoroughly, paying special attention to the area where your surgery  will be performed.  7.  Thoroughly rinse your body with warm water from the neck down.  8.  DO NOT shower/wash with your normal soap after using and rinsing off  the CHG Soap.                9.  Pat yourself dry with a clean towel.            10.  Wear clean pajamas.            11.  Place clean sheets on your bed the night of your first shower and do not  sleep with pets. Day of Surgery : Do not apply any lotions/deodorants the morning of surgery.  Please wear clean clothes to the hospital/surgery center.  FAILURE TO FOLLOW THESE INSTRUCTIONS MAY RESULT IN THE CANCELLATION OF YOUR SURGERY PATIENT SIGNATURE_________________________________  NURSE SIGNATURE__________________________________  ________________________________________________________________________   Adam Phenix  An incentive spirometer is a tool that can help keep your lungs clear and active. This tool measures how well you are filling your lungs with each breath. Taking long deep breaths may help reverse or decrease the chance of developing breathing (pulmonary) problems (especially infection) following:  A long period of time when you are unable to move or be active. BEFORE THE PROCEDURE   If the spirometer includes an indicator to show your best effort, your nurse or respiratory  therapist will set it to a desired goal.  If possible, sit up straight or lean slightly forward. Try not to slouch.  Hold the incentive spirometer in an upright position. INSTRUCTIONS FOR USE  1. Sit on the edge of your bed if possible, or sit up as far as you can in bed or on a chair. 2. Hold the incentive spirometer in an upright position. 3. Breathe out normally. 4. Place the mouthpiece in your mouth and seal your lips tightly around it. 5. Breathe in slowly and as deeply as possible, raising the piston or the ball toward the top of the column. 6. Hold your breath for 3-5 seconds or for as long as possible. Allow the piston  or ball to fall to the bottom of the column. 7. Remove the mouthpiece from your mouth and breathe out normally. 8. Rest for a few seconds and repeat Steps 1 through 7 at least 10 times every 1-2 hours when you are awake. Take your time and take a few normal breaths between deep breaths. 9. The spirometer may include an indicator to show your best effort. Use the indicator as a goal to work toward during each repetition. 10. After each set of 10 deep breaths, practice coughing to be sure your lungs are clear. If you have an incision (the cut made at the time of surgery), support your incision when coughing by placing a pillow or rolled up towels firmly against it. Once you are able to get out of bed, walk around indoors and cough well. You may stop using the incentive spirometer when instructed by your caregiver.  RISKS AND COMPLICATIONS  Take your time so you do not get dizzy or light-headed.  If you are in pain, you may need to take or ask for pain medication before doing incentive spirometry. It is harder to take a deep breath if you are having pain. AFTER USE  Rest and breathe slowly and easily.  It can be helpful to keep track of a log of your progress. Your caregiver can provide you with a simple table to help with this. If you are using the spirometer at home,  follow these instructions: [place] IF:   You are having difficultly using the spirometer.  You have trouble using the spirometer as often as instructed.  Your pain medication is not giving enough relief while using the spirometer.  You develop fever of 100.5 F (38.1 C) or higher. SEEK IMMEDIATE MEDICAL CARE IF:   You cough up bloody sputum that had not been present before.  You develop fever of 102 F (38.9 C) or greater.  You develop worsening pain at or near the incision site. MAKE SURE YOU:   Understand these instructions.  Will watch your condition.  Will get help right away if you are not doing well or get worse. Document Released: 12/16/2006 Document Revised: 10/28/2011 Document Reviewed: 02/16/2007 ExitCare Patient Information 2014 ExitCare, [place].   ________________________________________________________________________  WHAT IS A BLOOD TRANSFUSION? Blood Transfusion Information  A transfusion is the replacement of blood or some of its parts. Blood is made up of multiple cells which provide different functions.  Red blood cells carry oxygen and are used for blood loss replacement.  White blood cells fight against infection.  Platelets control bleeding.  Plasma helps clot blood.  Other blood products are available for specialized needs, such as hemophilia or other clotting disorders. BEFORE THE TRANSFUSION  Who gives blood for transfusions?   Healthy volunteers who are fully evaluated to make sure their blood is safe. This is blood bank blood. Transfusion therapy is the safest it has ever been in the practice of medicine. Before blood is taken from a donor, a complete history is taken to make sure that person has no history of diseases nor engages in risky social behavior (examples are intravenous drug use or sexual activity with multiple partners). The donor's travel history is screened to minimize risk of transmitting infections, such as malaria.  The donated blood is tested for signs of infectious diseases, such as HIV and hepatitis. The blood is then tested to be sure it is compatible with you in order to minimize the chance of a transfusion reaction. If you  or a relative donates blood, this is often done in anticipation of surgery and is not appropriate for emergency situations. It takes many days to process the donated blood. RISKS AND COMPLICATIONS Although transfusion therapy is very safe and saves many lives, the main dangers of transfusion include:   Getting an infectious disease.  Developing a transfusion reaction. This is an allergic reaction to something in the blood you were given. Every precaution is taken to prevent this. The decision to have a blood transfusion has been considered carefully by your caregiver before blood is given. Blood is not given unless the benefits outweigh the risks. AFTER THE TRANSFUSION  Right after receiving a blood transfusion, you will usually feel much better and more energetic. This is especially true if your red blood cells have gotten low (anemic). The transfusion raises the level of the red blood cells which carry oxygen, and this usually causes an energy increase.  The nurse administering the transfusion will monitor you carefully for complications. HOME CARE INSTRUCTIONS  No special instructions are needed after a transfusion. You may find your energy is better. Speak with your caregiver about any limitations on activity for underlying diseases you may have. SEEK MEDICAL CARE IF:   Your condition is not improving after your transfusion.  You develop redness or irritation at the intravenous (IV) site. SEEK IMMEDIATE MEDICAL CARE IF:  Any of the following symptoms occur over the next 12 hours:  Shaking chills.  You have a temperature by mouth above 102 F (38.9 C), not controlled by medicine.  Chest, back, or muscle pain.  People around you feel you are not acting correctly or are  confused.  Shortness of breath or difficulty breathing.  Dizziness and fainting.  You get a rash or develop hives.  You have a decrease in urine output.  Your urine turns a dark color or changes to pink, red, or brown. Any of the following symptoms occur over the next 10 days:  You have a temperature by mouth above 102 F (38.9 C), not controlled by medicine.  Shortness of breath.  Weakness after normal activity.  The white part of the eye turns yellow (jaundice).  You have a decrease in the amount of urine or are urinating less often.  Your urine turns a dark color or changes to pink, red, or brown. Document Released: 08/02/2000 Document Revised: 10/28/2011 Document Reviewed: 03/21/2008 [facility] Patient Information 2014 [place], [place].  _______________________________________________________________________

## 2019-06-29 NOTE — Progress Notes (Addendum)
PCP - [place] Cardiologist - Dr. Sunny Schlein Revankar  06/16/2019-  Pre-op clearance in EPIC  Chest x-ray - none EKG -06/16/2019  Stress Test - 06/17/2019 ECHO - 06/17/2019 Cardiac Cath - 05/08/2011-S/p cardiac stent 12/25/2018-MI  Sleep Study - none CPAP - none  Fasting Blood Sugar - none Checks Blood Sugar __0___ times a day  Blood Thinner Instructions:none Aspirin Instructions:Aspirin 81 mg-preventive Last Dose:06/29/2019  Instructed patient and his sister-in law to call Dr. Charlestine Night office and let them know when he stopped his Aspirin.  Anesthesia review:  Chart given to Ward Givens to review.  Patient has a history of HTN, CAD, Atrial Fibrillation, and CABG.  Patient denies shortness of breath, fever, cough and chest pain at PAT appointment   Patient verbalized understanding of instructions that were given to them at the PAT appointment. Patient was also instructed that they will need to review over the PAT instructions again at home before surgery.

## 2019-06-29 NOTE — H&P (View-Only) (Signed)
Richard Bowen is an 85 y.o. male.   Chief Complaint: low back pain HPI: The patient is a 85 year old male who presented to the office with the chief complaint of low back pain. He developed bilateral posterior thigh pain that did not improve with conservative measures including injection and oral corticosteroid use. CT myelogram showed severe spinal stenosis at L4-L5.   Past Medical History:  Diagnosis Date  . Arm pain 03/23/2013  . Arthritis   . Back pain 04/28/2012  . Carotid atherosclerosis 05/08/2011  . Coronary artery disease with history of myocardial infarction without history of CABG 12/25/2010   Coronary Artery Disease  . GERD (gastroesophageal reflux disease) 06/16/2019  . Heart murmur   . History of bladder cancer 09/29/2015  . Hyperlipidemia 06/16/2019  . Hypertension 06/16/2019  . Hypokalemia 09/28/2013  . Insomnia 04/28/2012  . Ischial bursitis 06/01/2019  . Mitral insufficiency 05/04/2013  . Neuropathy 04/28/2012  . Peripheral edema 06/09/2017  . Rupture of diaphragm 03/23/2013  . S/P coronary artery stent placement 05/08/2011  . Sinus bradycardia 01/22/2011    Past Surgical History:  Procedure Laterality Date  . APPENDECTOMY    . CARDIAC CATHETERIZATION  2012  . CAROTID STENT    . CORONARY ANGIOPLASTY  2012  . CORONARY ARTERY BYPASS GRAFT    . HERNIA REPAIR  10/18/2013    Family History  Problem Relation Age of Onset  . Cancer Mother   . Early death Mother   . Diabetes Father   . Stroke Father   . Early death Father   . Early death Sister    Social History:  reports that he quit smoking about 54 years ago. He has never used smokeless tobacco. He reports previous alcohol use. He reports that he does not use drugs.  Allergies:  Allergies  Allergen Reactions  . Rosuvastatin Other (See Comments)    Aches     Current Facility-Administered Medications:  .  [START ON 06/30/2019] bupivacaine liposome (EXPAREL) 1.3 % injection 266 mg, 20 mL, Infiltration, Once,  Ranee Gosselin, MD  Current Outpatient Medications:  .  ALPRAZolam (XANAX) 0.5 MG tablet, Take 0.5 mg by mouth at bedtime as needed for anxiety., Disp: , Rfl:  .  aspirin 81 MG chewable tablet, Chew 81 mg by mouth daily. , Disp: , Rfl:  .  benazepril (LOTENSIN) 20 MG tablet, Take 20 mg by mouth daily., Disp: , Rfl:  .  furosemide (LASIX) 20 MG tablet, Take 20 mg by mouth daily., Disp: , Rfl:  .  potassium chloride (KLOR-CON) 10 MEQ tablet, Take 10 mEq by mouth 2 (two) times daily., Disp: , Rfl:  .  pravastatin (PRAVACHOL) 20 MG tablet, Take 20 mg by mouth at bedtime. , Disp: , Rfl:   Results for orders placed or performed during the hospital encounter of 06/29/19 (from the past 48 hour(s))  Surgical pcr screen     Status: Abnormal   Collection Time: 06/29/19 10:59 AM   Specimen: Nasal Mucosa; Nasal Swab  Result Value Ref Range   MRSA, PCR NEGATIVE NEGATIVE   Staphylococcus aureus POSITIVE (A) NEGATIVE    Comment: (NOTE) The Xpert SA Assay (FDA approved for NASAL specimens in patients 34 years of age and older), is one component of a comprehensive surveillance program. It is not intended to diagnose infection nor to guide or monitor treatment. Performed at [facility], [address], [place], [place] [address]   ABO/Rh     Status: None   Collection  Time: 06/29/19 11:15 AM  Result Value Ref Range   ABO/RH(D)      A POS Performed at [facility], [address] [place], [place] [address]   APTT     Status: None   Collection Time: 06/29/19 11:29 AM  Result Value Ref Range   aPTT 25 24 - 36 seconds    Comment: Performed at [facility], [address] [place], [place] [address]  CBC WITH DIFFERENTIAL     Status: Abnormal   Collection Time: 06/29/19 11:29 AM  Result Value Ref Range   WBC 7.2 4.0 - 10.5 K/uL   RBC 3.62 (L) 4.22 - 5.81 MIL/uL   Hemoglobin 11.8 (L) 13.0 - 17.0 g/dL   HCT 30.837.4 (L) 65.739.0 - 84.652.0 %   MCV  103.3 (H) 80.0 - 100.0 fL   MCH 32.6 26.0 - 34.0 pg   MCHC 31.6 30.0 - 36.0 g/dL   RDW 96.215.2 95.211.5 - 84.115.5 %   Platelets 197 150 - 400 K/uL   nRBC 0.0 0.0 - 0.2 %   Neutrophils Relative % 76 %   Neutro Abs 5.5 1.7 - 7.7 K/uL   Lymphocytes Relative 13 %   Lymphs Abs 0.9 0.7 - 4.0 K/uL   Monocytes Relative 7 %   Monocytes Absolute 0.5 0.1 - 1.0 K/uL   Eosinophils Relative 2 %   Eosinophils Absolute 0.2 0.0 - 0.5 K/uL   Basophils Relative 1 %   Basophils Absolute 0.1 0.0 - 0.1 K/uL   Immature Granulocytes 1 %   Abs Immature Granulocytes 0.04 0.00 - 0.07 K/uL    Comment: Performed at [facility], [address] [place], [place] [address]  Comprehensive metabolic panel     Status: Abnormal   Collection Time: 06/29/19 11:29 AM  Result Value Ref Range   Sodium 144 135 - 145 mmol/L   Potassium 2.6 (LL) 3.5 - 5.1 mmol/L    Comment: CRITICAL RESULT CALLED TO, READ BACK BY AND VERIFIED WITH: Sherron FlemingsSTRENK,J. AC RN @1313  ON 11.10.2020 BY COHEN,K    Chloride 99 98 - 111 mmol/L   CO2 36 (H) 22 - 32 mmol/L   Glucose, Bld 111 (H) 70 - 99 mg/dL   BUN 20 8 - 23 mg/dL   Creatinine, Ser 1.021.35 (H) 0.61 - 1.24 mg/dL   Calcium 8.7 (L) 8.9 - 10.3 mg/dL   Total Protein 5.4 (L) 6.5 - 8.1 g/dL   Albumin 3.3 (L) 3.5 - 5.0 g/dL   AST 26 15 - 41 U/L   ALT 22 0 - 44 U/L   Alkaline Phosphatase 50 38 - 126 U/L   Total Bilirubin 1.5 (H) 0.3 - 1.2 mg/dL   GFR calc non Af Amer 48 (L) >60 mL/min   GFR calc Af Amer 55 (L) >60 mL/min   Anion gap 9 5 - 15    Comment: Performed at [facility], [address] [place], [place] [address]  Protime-INR     Status: None   Collection Time: 06/29/19 11:29 AM  Result Value Ref Range   Prothrombin Time 12.4 11.4 - 15.2 seconds   INR 0.9 0.8 - 1.2    Comment: (NOTE) INR goal varies based on device and disease states. Performed at [facility], [address] [place], [place] [address]   Type and screen Order type  and screen if day of surgery is less than 15 days from draw of preadmission visit or order morning  of surgery if day of surgery is greater than 6 days from preadmission visit.     Status: None   Collection Time: 06/29/19 11:29 AM  Result Value Ref Range   ABO/RH(D) A POS    Antibody Screen NEG    Sample Expiration 07/13/2019,2359    Extend sample reason      NO TRANSFUSIONS OR PREGNANCY IN THE PAST 3 MONTHS Performed at [facility], [place] [address], [place], [place] [address]    Dg Lumbar Spine 2-3 Views  Result Date: 06/29/2019 CLINICAL DATA:  Preop lumbar spine surgery. EXAM: LUMBAR SPINE - 2-3 VIEW COMPARISON:  None. FINDINGS: The lumbar spine levels have been annotated. Again seen is a scoliosis deformity which is convex towards the left. Multi level disc space narrowing and endplate spurring is identified. This is most advanced at L2-3, L3-4, and L5-S1. Aortic atherosclerosis. IMPRESSION: Lumbar scoliosis and degenerative disc disease. Electronically Signed   By: Kerby Moors M.D.   On: 06/29/2019 16:30   Vitals BP 140 / 80 Pulse 64 bpm regular   Review of Systems  Constitutional: Negative.   HENT: Negative.   Eyes: Negative.   Respiratory: Negative.   Cardiovascular: Negative.   Gastrointestinal: Negative.   Genitourinary: Negative.   Musculoskeletal: Positive for back pain, joint pain and myalgias. Negative for falls and neck pain.  Skin: Negative.   Neurological: Negative.   Endo/Heme/Allergies: Negative.   Psychiatric/Behavioral: Negative.    Physical Exam  Constitutional: He is oriented to person, place, and time. He appears well-developed and well-nourished. No distress.  HENT:  Head: Normocephalic and atraumatic.  Right Ear: External ear normal.  Left Ear: External ear normal.  Nose: Nose normal.  Mouth/Throat: Oropharynx is clear and moist.  Eyes: Conjunctivae and EOM are normal.  Neck: Normal range of motion. Neck supple.   Cardiovascular: Normal rate, regular rhythm and intact distal pulses.  Murmur heard.  Systolic murmur is present with a grade of 2/6. Respiratory: Effort normal and breath sounds normal. No respiratory distress. He has no wheezes.  GI: Soft. Bowel sounds are normal. He exhibits no distension. There is no abdominal tenderness.  Musculoskeletal:     Right hip: Normal.     Left hip: Normal.     Right knee: Normal.     Left knee: Normal.     Comments: Pain with motion of the lumbar spine with radiation of pain into the legs.   Neurological: He is alert and oriented to person, place, and time. He has normal strength. No sensory deficit.  Skin: No rash noted. He is not diaphoretic. No erythema.  Psychiatric: He has a normal mood and affect. His behavior is normal.     Assessment/Plan Lumbar spinal stenosis L4-L5 He needs a lumbar decompression L4-L5. Risks and benefits of the procedure discussed with the patient by Dr. Latanya Maudlin. Will stay overnight for observation.   H&P performed by Dr. Latanya Maudlin Documented by Ardeen Jourdain, PA-C  Trysta Showman Thomasenia Sales, PA-C 06/29/2019, 8:58 PM

## 2019-06-30 ENCOUNTER — Ambulatory Visit (HOSPITAL_COMMUNITY)
Admission: RE | Admit: 2019-06-30 | Discharge: 2019-06-30 | Disposition: A | Discharge status: Home / Self Care | Payer: Medicare HMO | Source: Other Acute Inpatient Hospital | Attending: Orthopedic Surgery | Admitting: Orthopedic Surgery

## 2019-06-30 ENCOUNTER — Encounter (HOSPITAL_COMMUNITY)
Admission: RE | Disposition: A | Discharge status: Home / Self Care | Payer: Self-pay | Source: Other Acute Inpatient Hospital | Attending: Orthopedic Surgery

## 2019-06-30 ENCOUNTER — Encounter (HOSPITAL_COMMUNITY): Payer: Self-pay | Admitting: *Deleted

## 2019-06-30 DIAGNOSIS — Z8551 Personal history of malignant neoplasm of bladder: Secondary | ICD-10-CM | POA: Diagnosis not present

## 2019-06-30 DIAGNOSIS — I10 Essential (primary) hypertension: Secondary | ICD-10-CM | POA: Insufficient documentation

## 2019-06-30 DIAGNOSIS — G4700 Insomnia, unspecified: Secondary | ICD-10-CM | POA: Insufficient documentation

## 2019-06-30 DIAGNOSIS — R011 Cardiac murmur, unspecified: Secondary | ICD-10-CM | POA: Diagnosis not present

## 2019-06-30 DIAGNOSIS — Z79899 Other long term (current) drug therapy: Secondary | ICD-10-CM | POA: Insufficient documentation

## 2019-06-30 DIAGNOSIS — I2510 Atherosclerotic heart disease of native coronary artery without angina pectoris: Secondary | ICD-10-CM | POA: Insufficient documentation

## 2019-06-30 DIAGNOSIS — Z951 Presence of aortocoronary bypass graft: Secondary | ICD-10-CM | POA: Insufficient documentation

## 2019-06-30 DIAGNOSIS — M419 Scoliosis, unspecified: Secondary | ICD-10-CM | POA: Insufficient documentation

## 2019-06-30 DIAGNOSIS — Z5309 Procedure and treatment not carried out because of other contraindication: Secondary | ICD-10-CM | POA: Insufficient documentation

## 2019-06-30 DIAGNOSIS — Z87891 Personal history of nicotine dependence: Secondary | ICD-10-CM | POA: Diagnosis not present

## 2019-06-30 DIAGNOSIS — M5416 Radiculopathy, lumbar region: Secondary | ICD-10-CM | POA: Diagnosis not present

## 2019-06-30 DIAGNOSIS — I252 Old myocardial infarction: Secondary | ICD-10-CM | POA: Insufficient documentation

## 2019-06-30 DIAGNOSIS — M48061 Spinal stenosis, lumbar region without neurogenic claudication: Secondary | ICD-10-CM | POA: Insufficient documentation

## 2019-06-30 DIAGNOSIS — K219 Gastro-esophageal reflux disease without esophagitis: Secondary | ICD-10-CM | POA: Diagnosis not present

## 2019-06-30 DIAGNOSIS — M5136 Other intervertebral disc degeneration, lumbar region: Secondary | ICD-10-CM | POA: Diagnosis present

## 2019-06-30 DIAGNOSIS — E785 Hyperlipidemia, unspecified: Secondary | ICD-10-CM | POA: Diagnosis not present

## 2019-06-30 DIAGNOSIS — Z955 Presence of coronary angioplasty implant and graft: Secondary | ICD-10-CM | POA: Diagnosis not present

## 2019-06-30 DIAGNOSIS — Z7982 Long term (current) use of aspirin: Secondary | ICD-10-CM | POA: Insufficient documentation

## 2019-06-30 DIAGNOSIS — E876 Hypokalemia: Secondary | ICD-10-CM | POA: Insufficient documentation

## 2019-06-30 DIAGNOSIS — I700 Atherosclerosis of aorta: Secondary | ICD-10-CM | POA: Insufficient documentation

## 2019-06-30 DIAGNOSIS — Z888 Allergy status to other drugs, medicaments and biological substances status: Secondary | ICD-10-CM | POA: Insufficient documentation

## 2019-06-30 DIAGNOSIS — R001 Bradycardia, unspecified: Secondary | ICD-10-CM | POA: Insufficient documentation

## 2019-06-30 DIAGNOSIS — Z809 Family history of malignant neoplasm, unspecified: Secondary | ICD-10-CM | POA: Insufficient documentation

## 2019-06-30 DIAGNOSIS — Z823 Family history of stroke: Secondary | ICD-10-CM | POA: Diagnosis not present

## 2019-06-30 LAB — TYPE AND SCREEN
ABO/RH(D): A POS
Antibody Screen: NEGATIVE

## 2019-06-30 LAB — BASIC METABOLIC PANEL
Anion gap: 9 (ref 5–15)
BUN: 19 mg/dL (ref 8–23)
CO2: 31 mmol/L (ref 22–32)
Calcium: 8.5 mg/dL — ABNORMAL LOW (ref 8.9–10.3)
Chloride: 102 mmol/L (ref 98–111)
Creatinine, Ser: 1.35 mg/dL — ABNORMAL HIGH (ref 0.61–1.24)
GFR calc Af Amer: 55 mL/min — ABNORMAL LOW (ref >60)
GFR calc non Af Amer: 48 mL/min — ABNORMAL LOW (ref >60)
Glucose, Bld: 141 mg/dL — ABNORMAL HIGH (ref 70–99)
Potassium: 2.5 mmol/L — CL (ref 3.5–5.1)
Sodium: 142 mmol/L (ref 135–145)

## 2019-06-30 SURGERY — LUMBAR LAMINECTOMY/DECOMPRESSION MICRODISCECTOMY 1 LEVEL
Anesthesia: General

## 2019-06-30 MED ORDER — LACTATED RINGERS IV SOLN
INTRAVENOUS | Status: DC
  Administered 2019-06-30: 09:00:00 via INTRAVENOUS

## 2019-06-30 MED ORDER — CHLORHEXIDINE GLUCONATE 4 % EX LIQD: 60.0000 mL | Freq: Once | CUTANEOUS | Status: DC

## 2019-06-30 MED ORDER — CEFAZOLIN SODIUM-DEXTROSE 2-4 GM/100ML-% IV SOLN
2.0000 g | INTRAVENOUS | Status: DC
  Filled 2019-06-30: qty 100

## 2019-06-30 NOTE — Progress Notes (Signed)
Giofree at bedside, wants patient to continue taking increased dose of potassium until rescheduled.   Pt denies any pain or discomfort.

## 2019-06-30 NOTE — Op Note (Signed)
NAMEATWELL, MCDANEL MEDICAL RECORD QI:[id number] ACCOUNT [id number] DATE OF BIRTH:November 13, 1931 FACILITY: [facility] LOCATION: [facility] PHYSICIAN:Tyiana Hill A. Zanae Kuehnle, MD  OPERATIVE REPORT  DATE OF PROCEDURE:  06/30/2019  This patient was scheduled for a lumbar laminectomy today.  We had to postpone his surgery because of a very low potassium.  The anesthesia spoke to me today and said they did not feel it was safe for a general anesthetic, so I definitely approved that  and I explained it to the patient and called his family.  His potassium was low several days ago.  We increased his potassium with the hopes of bringing it back up, but we decided first to repeat it this morning prior to surgery and it was even lower  than it was initially, so we are sending him to his cardiologist who gave [place] approval for the surgery and have him manage that and see if we can reschedule him once that is brought back to normal.  CN/NUANCE  D:06/30/2019 T:06/30/2019 JOB:[id number]

## 2019-06-30 NOTE — Progress Notes (Signed)
CRITICAL VALUE ALERT  Critical Value:  Potassium 2.5  Date & Time Notied:  06/30/2019 at 0930  Provider Notified: Dr. Christella Hartigan   Orders Received/Actions taken: Cancel procedure. She is talking with patient and Dr. Emiliano Dyer

## 2019-07-01 ENCOUNTER — Encounter: Payer: Self-pay | Admitting: Family

## 2019-07-01 ENCOUNTER — Ambulatory Visit (INDEPENDENT_AMBULATORY_CARE_PROVIDER_SITE_OTHER): Payer: Medicare HMO | Admitting: Family

## 2019-07-01 ENCOUNTER — Other Ambulatory Visit: Payer: Self-pay

## 2019-07-01 VITALS — BP 122/56 | HR 75 | Resp 18 | Ht 69.0 in | Wt 160.0 lb

## 2019-07-01 DIAGNOSIS — E876 Hypokalemia: Secondary | ICD-10-CM

## 2019-07-01 DIAGNOSIS — I2510 Atherosclerotic heart disease of native coronary artery without angina pectoris: Secondary | ICD-10-CM

## 2019-07-01 DIAGNOSIS — Z01810 Encounter for preprocedural cardiovascular examination: Secondary | ICD-10-CM | POA: Diagnosis not present

## 2019-07-01 DIAGNOSIS — I10 Essential (primary) hypertension: Secondary | ICD-10-CM

## 2019-07-01 DIAGNOSIS — E782 Mixed hyperlipidemia: Secondary | ICD-10-CM

## 2019-07-01 MED ORDER — MAGNESIUM 400 MG PO CAPS: 400.0000 mg | ORAL_CAPSULE | Freq: Every day | ORAL | 0 refills | Status: AC

## 2019-07-01 MED ORDER — POTASSIUM CHLORIDE CRYS ER 20 MEQ PO TBCR: 20.0000 meq | EXTENDED_RELEASE_TABLET | Freq: Every day | ORAL | 0 refills | Status: DC

## 2019-07-01 NOTE — Progress Notes (Signed)
Office Visit    Patient Name: Richard Bowen Date of Encounter: 07/01/2019  Primary Care Provider:  Pietro Cassis, MD Primary Cardiologist:  Richard Lindau, MD Electrophysiologist:  None   Chief Complaint    Richard Bowen is a 85 y.o. male with a hx of HTN, CAD s/p inferior MI and stent RCA 2006, carotid atherosclerosis s/p carotid stent, anxiety, HLD, hypokalemia  presents today for hypokalemia.   Past Medical History    Past Medical History:  Diagnosis Date  . Arm pain 03/23/2013  . Arthritis   . Back pain 04/28/2012  . Carotid atherosclerosis 05/08/2011  . Coronary artery disease with history of myocardial infarction without history of CABG 12/25/2010   Coronary Artery Disease  . GERD (gastroesophageal reflux disease) 06/16/2019  . Heart murmur   . History of bladder cancer 09/29/2015  . Hyperlipidemia 06/16/2019  . Hypertension 06/16/2019  . Hypokalemia 09/28/2013  . Insomnia 04/28/2012  . Ischial bursitis 06/01/2019  . Mitral insufficiency 05/04/2013  . Neuropathy 04/28/2012  . Peripheral edema 06/09/2017  . Rupture of diaphragm 03/23/2013  . S/P coronary artery stent placement 05/08/2011  . Sinus bradycardia 01/22/2011   Past Surgical History:  Procedure Laterality Date  . APPENDECTOMY    . CARDIAC CATHETERIZATION  2012  . CAROTID STENT    . CORONARY ANGIOPLASTY  2012  . CORONARY ARTERY BYPASS GRAFT    . HERNIA REPAIR  10/18/2013    Allergies  Allergies  Allergen Reactions  . Rosuvastatin Other (See Comments)    Aches    History of Present Illness    Richard Bowen is a 85 y.o. male with a hx of HTN, CAD s/p inferior MI and stent RCA 2006, carotid atherosclerosis s/p carotid stent, anxiety, HLD, hypokalemia last seen 06/16/19 by Richard Bowen. Seen at that time in consultation for preoperative clearance. He was recommended echo and stress test.   Stress test 06/17/19 intermediate risk. Nuclear stress EF 34% and large defect of severe severity present in  basal inferolateral and mid inferolateral location consistent with prior MI. Echo 06/17/19 LVEF 50-55%, grade III diastolic dysfunction, RV normal size/function, mild AI.  He presented for lumbar laminectomy yesterday with Richard Bowen for severe spinal stenosis at L4-L5. The procedure was held due to hypokalemia. Documentation of hypokalemia back to 2014 on record review. His recent potassium levels are below.   06/30/19 2.5  06/29/19 2.6  04/01/19 4.0  09/30/18 5.3  11/21/17 3.5   There is note from his primary care that his Lasix was stopped 04/01/2019.  When asked about it Richard Bowen cannot recall whether or not he is taking Lasix.  Informed to review his medicines and discontinue Lasix if presently taking.    He cannot recall what dose of potassium he was on.  His sister is present for the visit today and there is confusion on whether he had increased his potassium to 2 tablets or to 3 tablets.  I got a message to visit from most recent documentation by his PCP who prescribes his potassium noted dose of 10 mEq half tablet daily as of 04/19/2019. Richard Bowen tells me he did not take any potassium yesterday due to his procedure nor did he take any today.  He uses a pill organizer at home but does manage his own medications. This provider has concerns for memory impairment and cognitive dysfunction -likely inappropriate that he manages his own medications.  He denies chest pain, pressure.  He reports no nausea, vomiting, diarrhea  that could contribute to GI loss of potassium. Denies palpitations, muscle cramps, shortness of breath, DOE.  EKGs/Labs/Other Studies Reviewed:   The following studies were reviewed today:  Echo 06/17/19  1. Left ventricular ejection fraction, by visual estimation, is 50 to 55%. The left ventricle has normal function. There is mildly increased left ventricular hypertrophy.  2. Left ventricular diastolic parameters are consistent with Grade III diastolic dysfunction  (restrictive).  3. Global right ventricle has normal systolic function.The right ventricular size is normal. No increase in right ventricular wall thickness.  4. The aortic valve is normal in structure. Aortic valve regurgitation is mild. No evidence of aortic valve sclerosis or stenosis.   FINDINGS  Left Ventricle: Left ventricular ejection fraction, by visual estimation, is 50 to 55%. The left ventricle has normal function. There is mildly increased left ventricular hypertrophy. Left ventricular diastolic parameters are consistent with Grade III  diastolic dysfunction (restrictive). Normal left atrial pressure.   Right Ventricle: The right ventricular size is normal. No increase in right ventricular wall thickness. Global RV systolic function is has normal systolic function. The tricuspid regurgitant velocity is 2.58 m/s, and with an assumed right atrial pressure  of 3 mmHg, the estimated right ventricular systolic pressure is normal at 29.6 mmHg.   Left Atrium: Left atrial size was normal in size.   Right Atrium: Right atrial size was normal in size   Pericardium: There is no evidence of pericardial effusion.   Mitral Valve: The mitral valve is normal in structure. No evidence of mitral valve stenosis by observation. No evidence of mitral valve regurgitation.   Tricuspid Valve: The tricuspid valve is normal in structure. Tricuspid valve regurgitation is trivial.   Aortic Valve: The aortic valve is normal in structure. Aortic valve regurgitation is mild. Aortic regurgitation PHT measures 782 msec. The aortic valve is structurally normal, with no evidence of sclerosis or stenosis. Aortic valve mean gradient measures  7.0 mmHg. Aortic valve peak gradient measures 14.0 mmHg. Aortic valve area, by VTI measures 2.07 cm.   Pulmonic Valve: The pulmonic valve was normal in structure. Pulmonic valve regurgitation is not visualized.   Aorta: The aortic root, ascending aorta and aortic arch are all  structurally normal, with no evidence of dilitation or obstruction.   Venous: The inferior vena cava is normal in size with greater than 50% respiratory variability, suggesting right atrial pressure of 3 mmHg.   IAS/Shunts: No atrial level shunt detected by color flow Doppler. No ventricular septal defect is seen or detected. There is no evidence of an atrial septal defect.  Lexiscan 06/17/19  Nuclear stress EF: 34%.  There was no ST segment deviation noted during stress.  Defect 1: There is a large defect of severe severity present in the basal inferolateral and mid inferolateral location.  Findings consistent with prior myocardial infarction.  This is an intermediate risk study.  The left ventricular ejection fraction is moderately decreased (30-44%).   Abnormal, intermediate risk stress nuclear study with large prior inferior lateral infarct.  No ischemia.  Gated ejection fraction 34% with akinesis of the inferolateral wall.  Mild left ventricular enlargement.  Study interpreted as intermediate risk due to reduced LV function.  EKG:  No EKG today.  Recent Labs: 06/29/2019: ALT 22; Hemoglobin 11.8; Platelets 197 06/30/2019: BUN 19; Creatinine, Ser 1.35; Potassium 2.5; Sodium 142  Recent Lipid Panel No results found for: CHOL, TRIG, HDL, CHOLHDL, VLDL, LDLCALC, LDLDIRECT  Home Medications   Current Meds  Medication Sig  .  ALPRAZolam (XANAX) 0.5 MG tablet Take 0.5 mg by mouth at bedtime as needed for anxiety.  Marland Kitchen. aspirin 81 MG chewable tablet Chew 81 mg by mouth daily.   . benazepril (LOTENSIN) 20 MG tablet Take 20 mg by mouth daily.  . pravastatin (PRAVACHOL) 20 MG tablet Take 20 mg by mouth at bedtime.   . [DISCONTINUED] potassium chloride (KLOR-CON) 10 MEQ tablet Take 10 mEq by mouth 2 (two) times daily.      Review of Systems       Review of Systems  Constitution: Negative for chills, fever and malaise/fatigue.  Cardiovascular: Negative for chest pain, dyspnea on  exertion, irregular heartbeat, leg swelling, near-syncope, orthopnea and palpitations.  Respiratory: Negative for cough, shortness of breath and wheezing.   Musculoskeletal: Positive for back pain. Negative for muscle cramps.  Gastrointestinal: Negative for constipation, diarrhea, nausea and vomiting.  Neurological: Negative for dizziness, light-headedness and weakness.   All other systems reviewed and are otherwise negative except as noted above.  Physical Exam    VS:  BP (!) 122/56 (BP Location: Right Arm, Patient Position: Sitting, Cuff Size: Normal)   Pulse 75   Resp 18   SpO2 99%  , BMI There is no height or weight on file to calculate BMI. GEN: Well nourished, thin, well developed, in no acute distress. HEENT: normal. Neck: Supple, no JVD, carotid bruits, or masses. Cardiac: RRR, no rubs, or gallops. Grade 2/6 systolic murmur. No clubbing, cyanosis, edema.  Radials/DP/PT 2+ and equal bilaterally.  Respiratory:  Respirations regular and unlabored, clear to auscultation bilaterally. GI: Soft, nontender, nondistended, BS + x 4. MS: No deformity or atrophy. Skin: Warm and dry, no rash. Scattered ecchymosis and small, healing lacerations noted to bilateral arm, forehead. Neuro:  Strength and sensation are intact. Noted memory impairment.  Psych: Normal affect.  Assessment & Plan    1. Hypokalemia - K 2.6 on 07/09/19 and K 2.5 on 06/30/19. No clear cause. No recent GI illness, no increased urinary output, no diarrhea. Reports no symptoms: no muscle cramps, palpitations.   Cannot recall if he is taking Lasix (marked as stopped 03/2019 by PCP), stop Lasix if taking.   He is uncertain what dose of K he has been taking. Per most recent Rx by PCP taking K 10mEq half tablet daily. He thinks he has increased to twice daily, but not certain.   START Potassium 20mEq daily.   START Magnesium 400mg  daily.   Repeat BMET, Mag on Monday.   Further management of hypokalemia appropriate by PCP.    2. Preop cardiovascular exam - CAD with no anginal symptoms. Lexiscan 06/17/19 intermediate risk with large defect consistent with known prior MI. Echo 06/17/19 LVEF 50-55%, grade III diastolic dysfunction, mild AI. Pending improvement in his hypokalemia he is deemed appropriate cardiovascular risk and may proceed with surgery.    3. CAD - s/p MI and stent RCA 2006. No anginal symptoms. Lexiscan 06/17/19 with fixed defect consistent with prior MI. No indication for further ischemic evaluation.  Continue GDMT aspirin, statin.   4. HTN - BP well controlled.  Continue Lotensin 20mg  daily.  5. HLD - Lipid panel 09/30/18 total 179, triglycerides 124, HDL 69, LDL 85. Would recommend LDL goal <70 in setting of CAD. Continue to follow with PCP. Continue Pravastatin.   Difficult visit due to uncertainty regarding medications. Likely not appropriate that he manage his own medications. His sister was present for our visit today and I have asked that she assist him with  his medications.   Disposition: Labs Monday (BMET, magnesium). Follow up in 3 month(s) with Dr. Tomie China.   Alver Sorrow, NP 07/01/2019, 9:40 AM

## 2019-07-01 NOTE — Patient Instructions (Addendum)
Medication Instructions:   CHANGE Potassium to 20 mEq (one tablet) daily. A NEW prescription has been sent to [facility].   START Magnesium 400mg  daily. This is an over the counter supplement. Your pharmacist can help you locate it.   CALL our office with how you have been currently taking your medications.   *If you need a refill on your cardiac medications before your next appointment, please call your pharmacy*  Lab Work: Your physician recommends that you return for lab work on Monday, November 16 at our [employer] Please come 8 AM - 4:30 PM. You do not need an appointment, simply drop by the office but avoid the 12p - 1p hour. We will collect a BMET to assess your potassium level.  If you have labs (blood work) drawn today and your tests are completely normal, you will receive your results only by: Marland Kitchen MyChart Message (if you have MyChart) OR . A paper copy in the mail If you have any lab test that is abnormal or we need to change your treatment, we will call you to review the results.  Testing/Procedures: None today.  Follow-Up: At [facility], you and your health needs are our priority.  As part of our continuing mission to provide you with exceptional heart care, we have created designated Provider Care Teams.  These Care Teams include your primary Cardiologist (physician) and Advanced Practice Providers (APPs -  Physician Assistants and Nurse Practitioners) who all work together to provide you with the care you need, when you need it.  Your next appointment:   3 months  The format for your next appointment:   In Person  Provider:   You may see Jenean Lindau, MD or the following Advanced Practice Provider on your designated Care Team:    Laurann Montana, FNP   Other Instructions  Take potassium as prescribed.   Please make sure you are NOT taking Furosemide (Lasix). If you are taking this medication, STOP taking it. It can lower your potassium.    Please start a NEW pill box for this week with the new potassium prescription.   Have someone review your medications WITH you.    Hypokalemia Hypokalemia means that the amount of potassium in the blood is lower than normal. Potassium is a chemical (electrolyte) that helps regulate the amount of fluid in the body. It also stimulates muscle tightening (contraction) and helps nerves work properly. Normally, most of the body's potassium is inside cells, and only a very small amount is in the blood. Because the amount in the blood is so small, minor changes to potassium levels in the blood can be life-threatening. What are the causes? This condition may be caused by:  Antibiotic medicine.  Diarrhea or vomiting. Taking too much of a medicine that helps you have a bowel movement (laxative) can cause diarrhea and lead to hypokalemia.  Chronic kidney disease (CKD).  Medicines that help the body get rid of excess fluid (diuretics).  Eating disorders, such as bulimia.  Low magnesium levels in the body.  Sweating a lot. What are the signs or symptoms? Symptoms of this condition include:  Weakness.  Constipation.  Fatigue.  Muscle cramps.  Mental confusion.  Skipped heartbeats or irregular heartbeat (palpitations).  Tingling or numbness. How is this diagnosed? This condition is diagnosed with a blood test. How is this treated? This condition may be treated by:  Taking potassium supplements by mouth.  Adjusting the medicines that you take.  Eating more  foods that contain a lot of potassium. If your potassium level is very low, you may need to get potassium through an IV and be monitored in the hospital. Follow these instructions at home:   Take over-the-counter and prescription medicines only as told by your health care provider. This includes vitamins and supplements.  Eat a healthy diet. A healthy diet includes fresh fruits and vegetables, whole grains, healthy fats, and  lean proteins.  If instructed, eat more foods that contain a lot of potassium. This includes: ? Nuts, such as peanuts and pistachios. ? Seeds, such as sunflower seeds and pumpkin seeds. ? Peas, lentils, and lima beans. ? Whole grain and bran cereals and breads. ? Fresh fruits and vegetables, such as apricots, avocado, bananas, cantaloupe, kiwi, oranges, tomatoes, asparagus, and potatoes. ? Orange juice. ? Tomato juice. ? Red meats. ? Yogurt.  Keep all follow-up visits as told by your health care provider. This is important. Contact a health care provider if you:  Have weakness that gets worse.  Feel your heart pounding or racing.  Vomit.  Have diarrhea.  Have diabetes (diabetes mellitus) and you have trouble keeping your blood sugar (glucose) in your target range. Get help right away if you:  Have chest pain.  Have shortness of breath.  Have vomiting or diarrhea that lasts for more than 2 days.  Faint. Summary  Hypokalemia means that the amount of potassium in the blood is lower than normal.  This condition is diagnosed with a blood test.  Hypokalemia may be treated by taking potassium supplements, adjusting the medicines that you take, or eating more foods that are high in potassium.  If your potassium level is very low, you may need to get potassium through an IV and be monitored in the hospital. This information is not intended to replace advice given to you by your health care provider. Make sure you discuss any questions you have with your health care provider. Document Released: 08/05/2005 Document Revised: 03/18/2018 Document Reviewed: 03/18/2018 Elsevier Patient Education  2020 [employer].

## 2019-07-05 ENCOUNTER — Encounter (HOSPITAL_COMMUNITY): Payer: Self-pay | Admitting: Physician Assistant

## 2019-07-05 NOTE — Progress Notes (Signed)
cardaic clearance dr Geraldo Pitter 06-20-19 on chart 10-192- stress test on chart Echo 06-17-19 on chart/epic

## 2019-07-05 NOTE — H&P (View-Only) (Signed)
cardaic clearance dr revankar 06-20-19 on chart 10-192- stress test on chart Echo 06-17-19 on chart/epic 

## 2019-07-05 NOTE — Progress Notes (Signed)
PCP - [place] Cardiologist - Dr. Sunny Schlein Revankar  06/16/2019-  Pre-op clearance in [place], [place] cardiology 11-12 with Laurann Montana, NP updated cardiac clearance   Chest x-ray - none EKG -06/16/2019  Stress Test - 06/17/2019 ECHO - 06/17/2019 Cardiac Cath - 05/08/2011-S/p cardiac stent 12/25/2018-MI  Sleep Study - none CPAP - none  Fasting Blood Sugar - none Checks Blood Sugar __0___ times a day  Blood Thinner Instructions:none Aspirin Instructions:Aspirin 81 mg-preventive Last Dose:   Anesthesia review:  Patient cancelled for lumbar decompression on 11-11 due to critically low potassium. In the interim , he has seen cardio for re-eval and when speaking today with patient's sister Lucretia Roers, RN confirmed patient has been taking magnesium and potassium as ordered and that the sister is taking him today to check his potassium levels as the cardiologist also ordered. Pre-op instructions were given to patient's sister; she will be bringing patient for surgery. RN also called patient on his home phone , no answer, lvmm that surgery info was given to his sister but that he may call RN to also get surgery instructions if he would like to speak to me directly.    Patient has a history of HTN, CAD, Atrial Fibrillation, and CABG.    Patient sister Lucretia Roers, verbalized understanding of instructions that were given to them at time of pre-op phone call.

## 2019-07-06 ENCOUNTER — Telehealth: Payer: Self-pay

## 2019-07-06 LAB — BASIC METABOLIC PANEL
BUN/Creatinine Ratio: 11 (ref 10–24)
BUN: 19 mg/dL (ref 8–27)
CO2: 24 mmol/L (ref 20–29)
Calcium: 9.1 mg/dL (ref 8.6–10.2)
Chloride: 108 mmol/L — ABNORMAL HIGH (ref 96–106)
Creatinine, Ser: 1.74 mg/dL — ABNORMAL HIGH (ref 0.76–1.27)
GFR calc Af Amer: 40 mL/min/{1.73_m2} — ABNORMAL LOW (ref >59)
GFR calc non Af Amer: 35 mL/min/{1.73_m2} — ABNORMAL LOW (ref >59)
Glucose: 157 mg/dL — ABNORMAL HIGH (ref 65–99)
Potassium: 6.6 mmol/L (ref 3.5–5.2)
Sodium: 145 mmol/L — ABNORMAL HIGH (ref 134–144)

## 2019-07-06 LAB — MAGNESIUM: Magnesium: 3 mg/dL — ABNORMAL HIGH (ref 1.6–2.3)

## 2019-07-06 NOTE — Telephone Encounter (Signed)
Return call from Alvina Filbert and she notified RN that patient was already in [place], RN called facility spoke with Raquel Sarna (1st floor RN) and informed that patients potassium was 7.8 last night upon admission. It is closely being monitored and patient is still inpatient.

## 2019-07-07 ENCOUNTER — Encounter (HOSPITAL_COMMUNITY): Admission: RE | Payer: Self-pay | Source: Home / Self Care

## 2019-07-07 ENCOUNTER — Ambulatory Visit (HOSPITAL_COMMUNITY): Admission: RE | Admit: 2019-07-07 | Payer: Medicare HMO | Source: Home / Self Care | Admitting: Orthopedic Surgery

## 2019-07-07 SURGERY — LUMBAR LAMINECTOMY/DECOMPRESSION MICRODISCECTOMY 1 LEVEL
Anesthesia: General

## 2019-07-13 ENCOUNTER — Telehealth: Payer: Self-pay | Admitting: Cardiology

## 2019-07-13 NOTE — Telephone Encounter (Signed)
Please send letter of release for surgery, not yet scheduled due to potassium

## 2019-07-14 NOTE — Telephone Encounter (Signed)
Spoke with Stacie Glaze, NP at Mr. Reola Mosher PCP office with [facility].   His potassium drawn yesterday 07/14/19 was mildly elevated at 5.3. He was instructed to return to his PCP office Monday for repeat lab draw. He is presently not on potassium supplementation. However, in speaking with Raford Pitcher there was marked confusion regarding his labs at her last visit as there was at my visit with him.  As we are not prescribing any potassium supplements nor potassium lowering medications - PCP office will take over monitoring and treatment of hypo/hyperkalemia.   From a cardiac perspective he is deemed appropriate risk to proceed with his surgery pending the normalization of his potassium. PCP office was given contact information for Dr. Charlestine Night office and will discuss with them as well.   Loel Dubonnet, NP

## 2019-07-14 NOTE — Addendum Note (Signed)
Addended by: Loel Dubonnet on: 07/14/2019 02:25 PM   Modules accepted: Orders

## 2019-07-14 NOTE — Telephone Encounter (Signed)
Called and spoke with Richard Bowen, the patient's sister, per DPR.  Initially seen by Dr. Geraldo Pitter for cardiac clearance for surgery. He had Lexiscan and echo which were unremarkable. Seen 07/01/19 for hypokalemia prior to surgery with K 2.5. He was started on  Mr. Dobie was admitted to [facility] for about 3 days. Presented to [facility] ED after fall and had CT scan which was reportedly fine. His potassium was 7 on admission per his sister's report. His potassium was down to 4.8 on discharge.    Since getting home he has felt well. Is bothered by his back pain and anticipates surgery.   Was seen at his primary care office yesterday and labs were collected. 07/13/19 potassium 5.3, creatinine 1.25, GFR 52, Na 142. His sister tells me he has already stopped his potassium supplement.   Anticipate his PCP can manage his hyper/hypokalemia prior to surgery. Per his sister their office is much closer for them. No Rx for potassium lowering or elevating medications by this office. Per his report he is not taking Lasix, confirmed with PCP that they have not filled it recently. His potassium supplementation has been discontinued.  Called PCP office, await call back from provider.   Richard Dubonnet, NP

## 2019-07-19 ENCOUNTER — Encounter: Payer: Self-pay | Admitting: Family

## 2019-07-19 ENCOUNTER — Telehealth: Payer: Self-pay | Admitting: Family

## 2019-07-19 NOTE — Telephone Encounter (Signed)
Received call from Alvina Filbert, Richard Bowen's sister-in-law.  She is requesting cardiac clearance be faxed to [place] Chard at Dr. Charlestine Night office. Told her that I faxed cardiac clearance last week and will resend today.   His PCP office is following his hyperkalemia/hypokalemia. He is on no potassium lowering medications such as Lasix nor is he presently on potassium supplement. As our office does not prescribe any of his medications, his PCP office has agreed to manage his potassium.  07/13/19 with K 5.3 while on no supplementation. He was supposed to go today for repeat lab work, but sister-in-law was unaware. She will call PCP to see if they can draw today.  From a cardiac perspective, he is cleared for surgery pending normal potassium.   Richard Dubonnet, NP

## 2019-07-20 ENCOUNTER — Encounter (HOSPITAL_COMMUNITY): Payer: Self-pay

## 2019-07-20 ENCOUNTER — Other Ambulatory Visit: Payer: Self-pay

## 2019-07-20 NOTE — Progress Notes (Signed)
PCP - Dr. Larose Kells Cardiologist - Dr. Geraldo Pitter Last office visit 06/16/2019 in epic  Chest x-ray - N/A EKG - 06/16/2019 in epic Stress Test - 06/17/2019 in epic ECHO - 06/17/2019 in epic Cardiac Cath - greater than 2 years ago  Sleep Study - N/A CPAP - N/A  Fasting Blood Sugar - N/A Checks Blood Sugar _N/A____ times a day  Blood Thinner Instructions:  N/A Aspirin Instructions: Last Dose: 07/19/2019 per pt  Anesthesia review: CAD, MI, A. Fib, HTN, Carotid Atherosclerosis  Patient denies shortness of breath, fever, cough and chest pain at PAT appointment   Patient verbalized understanding of instructions that were given to them at the PAT appointment. Patient was also instructed that they will need to review over the PAT instructions again at home before surgery.

## 2019-07-21 ENCOUNTER — Other Ambulatory Visit (HOSPITAL_COMMUNITY)
Admission: RE | Admit: 2019-07-21 | Discharge: 2019-07-21 | Disposition: A | Discharge status: Home / Self Care | Payer: Medicare HMO | Source: Ambulatory Visit | Attending: Orthopedic Surgery | Admitting: Orthopedic Surgery

## 2019-07-21 DIAGNOSIS — Z01812 Encounter for preprocedural laboratory examination: Secondary | ICD-10-CM | POA: Diagnosis present

## 2019-07-21 DIAGNOSIS — Z20828 Contact with and (suspected) exposure to other viral communicable diseases: Secondary | ICD-10-CM | POA: Insufficient documentation

## 2019-07-21 LAB — SARS CORONAVIRUS 2 (TAT 6-24 HRS): SARS Coronavirus 2: NEGATIVE

## 2019-07-22 ENCOUNTER — Encounter (HOSPITAL_COMMUNITY): Payer: Self-pay | Admitting: *Deleted

## 2019-07-22 ENCOUNTER — Ambulatory Visit (HOSPITAL_COMMUNITY): Payer: Medicare HMO | Admitting: Physician Assistant

## 2019-07-22 ENCOUNTER — Ambulatory Visit (HOSPITAL_COMMUNITY): Payer: Medicare HMO

## 2019-07-22 ENCOUNTER — Other Ambulatory Visit: Payer: Self-pay

## 2019-07-22 ENCOUNTER — Encounter (HOSPITAL_COMMUNITY)
Admission: RE | Disposition: A | Discharge status: Home / Self Care | Payer: Self-pay | Source: Home / Self Care | Attending: Orthopedic Surgery

## 2019-07-22 ENCOUNTER — Observation Stay (HOSPITAL_COMMUNITY)
Admission: RE | Admit: 2019-07-22 | Discharge: 2019-07-23 | Disposition: A | Discharge status: Home / Self Care | Payer: Medicare HMO | Attending: Orthopedic Surgery | Admitting: Orthopedic Surgery

## 2019-07-22 DIAGNOSIS — I252 Old myocardial infarction: Secondary | ICD-10-CM | POA: Diagnosis not present

## 2019-07-22 DIAGNOSIS — Z7982 Long term (current) use of aspirin: Secondary | ICD-10-CM | POA: Insufficient documentation

## 2019-07-22 DIAGNOSIS — Z955 Presence of coronary angioplasty implant and graft: Secondary | ICD-10-CM | POA: Diagnosis not present

## 2019-07-22 DIAGNOSIS — M21372 Foot drop, left foot: Secondary | ICD-10-CM | POA: Diagnosis not present

## 2019-07-22 DIAGNOSIS — I4819 Other persistent atrial fibrillation: Secondary | ICD-10-CM | POA: Insufficient documentation

## 2019-07-22 DIAGNOSIS — Z87891 Personal history of nicotine dependence: Secondary | ICD-10-CM | POA: Diagnosis not present

## 2019-07-22 DIAGNOSIS — Z8551 Personal history of malignant neoplasm of bladder: Secondary | ICD-10-CM | POA: Insufficient documentation

## 2019-07-22 DIAGNOSIS — I10 Essential (primary) hypertension: Secondary | ICD-10-CM | POA: Insufficient documentation

## 2019-07-22 DIAGNOSIS — I2510 Atherosclerotic heart disease of native coronary artery without angina pectoris: Secondary | ICD-10-CM | POA: Insufficient documentation

## 2019-07-22 DIAGNOSIS — Z419 Encounter for procedure for purposes other than remedying health state, unspecified: Secondary | ICD-10-CM

## 2019-07-22 DIAGNOSIS — E785 Hyperlipidemia, unspecified: Secondary | ICD-10-CM | POA: Diagnosis not present

## 2019-07-22 DIAGNOSIS — Z888 Allergy status to other drugs, medicaments and biological substances status: Secondary | ICD-10-CM | POA: Insufficient documentation

## 2019-07-22 DIAGNOSIS — M1990 Unspecified osteoarthritis, unspecified site: Secondary | ICD-10-CM | POA: Diagnosis not present

## 2019-07-22 DIAGNOSIS — M48062 Spinal stenosis, lumbar region with neurogenic claudication: Secondary | ICD-10-CM | POA: Diagnosis not present

## 2019-07-22 DIAGNOSIS — Z79899 Other long term (current) drug therapy: Secondary | ICD-10-CM | POA: Diagnosis not present

## 2019-07-22 DIAGNOSIS — F419 Anxiety disorder, unspecified: Secondary | ICD-10-CM | POA: Insufficient documentation

## 2019-07-22 HISTORY — DX: Scoliosis, unspecified: M41.9

## 2019-07-22 HISTORY — DX: Anxiety disorder, unspecified: F41.9

## 2019-07-22 HISTORY — DX: Atherosclerosis of aorta: I70.0

## 2019-07-22 HISTORY — DX: Spinal stenosis, lumbar region without neurogenic claudication: M48.061

## 2019-07-22 HISTORY — DX: Hyperkalemia: E87.5

## 2019-07-22 HISTORY — DX: Other persistent atrial fibrillation: I48.19

## 2019-07-22 HISTORY — DX: Other intervertebral disc degeneration, lumbar region: M51.36

## 2019-07-22 HISTORY — DX: Presence of spectacles and contact lenses: Z97.3

## 2019-07-22 HISTORY — PX: LUMBAR LAMINECTOMY/DECOMPRESSION MICRODISCECTOMY: SHX5026

## 2019-07-22 LAB — CBC WITH DIFFERENTIAL/PLATELET
Abs Immature Granulocytes: 0.03 10*3/uL (ref 0.00–0.07)
Basophils Absolute: 0.1 10*3/uL (ref 0.0–0.1)
Basophils Relative: 1 %
Eosinophils Absolute: 0.1 10*3/uL (ref 0.0–0.5)
Eosinophils Relative: 1 %
HCT: 39.9 % (ref 39.0–52.0)
Hemoglobin: 12.6 g/dL — ABNORMAL LOW (ref 13.0–17.0)
Immature Granulocytes: 0 %
Lymphocytes Relative: 15 %
Lymphs Abs: 1.2 10*3/uL (ref 0.7–4.0)
MCH: 33.2 pg (ref 26.0–34.0)
MCHC: 31.6 g/dL (ref 30.0–36.0)
MCV: 105.3 fL — ABNORMAL HIGH (ref 80.0–100.0)
Monocytes Absolute: 0.6 10*3/uL (ref 0.1–1.0)
Monocytes Relative: 8 %
Neutro Abs: 5.8 10*3/uL (ref 1.7–7.7)
Neutrophils Relative %: 75 %
Platelets: 268 10*3/uL (ref 150–400)
RBC: 3.79 MIL/uL — ABNORMAL LOW (ref 4.22–5.81)
RDW: 15 % (ref 11.5–15.5)
WBC: 7.8 10*3/uL (ref 4.0–10.5)
nRBC: 0 % (ref 0.0–0.2)

## 2019-07-22 LAB — COMPREHENSIVE METABOLIC PANEL
ALT: 14 U/L (ref 0–44)
AST: 18 U/L (ref 15–41)
Albumin: 3.7 g/dL (ref 3.5–5.0)
Alkaline Phosphatase: 52 U/L (ref 38–126)
Anion gap: 10 (ref 5–15)
BUN: 29 mg/dL — ABNORMAL HIGH (ref 8–23)
CO2: 24 mmol/L (ref 22–32)
Calcium: 9 mg/dL (ref 8.9–10.3)
Chloride: 108 mmol/L (ref 98–111)
Creatinine, Ser: 1.33 mg/dL — ABNORMAL HIGH (ref 0.61–1.24)
GFR calc Af Amer: 56 mL/min — ABNORMAL LOW (ref >60)
GFR calc non Af Amer: 48 mL/min — ABNORMAL LOW (ref >60)
Glucose, Bld: 109 mg/dL — ABNORMAL HIGH (ref 70–99)
Potassium: 4 mmol/L (ref 3.5–5.1)
Sodium: 142 mmol/L (ref 135–145)
Total Bilirubin: 0.6 mg/dL (ref 0.3–1.2)
Total Protein: 6.2 g/dL — ABNORMAL LOW (ref 6.5–8.1)

## 2019-07-22 LAB — TYPE AND SCREEN
ABO/RH(D): A POS
Antibody Screen: NEGATIVE

## 2019-07-22 LAB — APTT: aPTT: 29 seconds (ref 24–36)

## 2019-07-22 LAB — PROTIME-INR
INR: 0.9 (ref 0.8–1.2)
Prothrombin Time: 12 seconds (ref 11.4–15.2)

## 2019-07-22 SURGERY — LUMBAR LAMINECTOMY/DECOMPRESSION MICRODISCECTOMY 1 LEVEL
Anesthesia: General

## 2019-07-22 MED ORDER — ROCURONIUM BROMIDE 10 MG/ML (PF) SYRINGE
PREFILLED_SYRINGE | INTRAVENOUS | Status: DC | PRN
  Administered 2019-07-22: 10 mg via INTRAVENOUS
  Administered 2019-07-22: 50 mg via INTRAVENOUS
  Administered 2019-07-22: 10 mg via INTRAVENOUS

## 2019-07-22 MED ORDER — ONDANSETRON HCL 4 MG/2ML IJ SOLN: 4.0000 mg | Freq: Four times a day (QID) | INTRAMUSCULAR | Status: DC | PRN

## 2019-07-22 MED ORDER — THROMBIN (RECOMBINANT) 5000 UNITS EX SOLR
CUTANEOUS | Status: AC
  Filled 2019-07-22: qty 10000

## 2019-07-22 MED ORDER — ALPRAZOLAM 0.5 MG PO TABS: 0.5000 mg | ORAL_TABLET | Freq: Every evening | ORAL | Status: DC | PRN

## 2019-07-22 MED ORDER — DEXAMETHASONE SODIUM PHOSPHATE 10 MG/ML IJ SOLN
INTRAMUSCULAR | Status: DC | PRN
  Administered 2019-07-22: 8 mg via INTRAVENOUS

## 2019-07-22 MED ORDER — BACITRACIN-NEOMYCIN-POLYMYXIN OINTMENT TUBE
TOPICAL_OINTMENT | CUTANEOUS | Status: AC
  Filled 2019-07-22: qty 14.17

## 2019-07-22 MED ORDER — THROMBIN (RECOMBINANT) 5000 UNITS EX SOLR
CUTANEOUS | Status: AC
  Filled 2019-07-22: qty 5000

## 2019-07-22 MED ORDER — EPHEDRINE SULFATE-NACL 50-0.9 MG/10ML-% IV SOSY
PREFILLED_SYRINGE | INTRAVENOUS | Status: DC | PRN
  Administered 2019-07-22 (×2): 5 mg via INTRAVENOUS

## 2019-07-22 MED ORDER — CEFAZOLIN SODIUM-DEXTROSE 2-4 GM/100ML-% IV SOLN
2.0000 g | INTRAVENOUS | Status: AC
  Administered 2019-07-22: 2 g via INTRAVENOUS
  Filled 2019-07-22: qty 100

## 2019-07-22 MED ORDER — ACETAMINOPHEN 500 MG PO TABS: 500.0000 mg | ORAL_TABLET | Freq: Three times a day (TID) | ORAL | Status: DC | PRN

## 2019-07-22 MED ORDER — METHOCARBAMOL 500 MG PO TABS
500.0000 mg | ORAL_TABLET | Freq: Four times a day (QID) | ORAL | Status: DC | PRN
  Administered 2019-07-23: 500 mg via ORAL
  Filled 2019-07-22: qty 1

## 2019-07-22 MED ORDER — MENTHOL 3 MG MT LOZG: 1.0000 | LOZENGE | OROMUCOSAL | Status: DC | PRN

## 2019-07-22 MED ORDER — CEFAZOLIN SODIUM-DEXTROSE 1-4 GM/50ML-% IV SOLN
1.0000 g | Freq: Three times a day (TID) | INTRAVENOUS | Status: DC
  Administered 2019-07-22 – 2019-07-23 (×2): 1 g via INTRAVENOUS
  Filled 2019-07-22 (×2): qty 50

## 2019-07-22 MED ORDER — SODIUM CHLORIDE (PF) 0.9 % IJ SOLN
INTRAMUSCULAR | Status: AC
  Filled 2019-07-22: qty 50

## 2019-07-22 MED ORDER — LACTATED RINGERS IV SOLN
INTRAVENOUS | Status: DC
  Administered 2019-07-22: 13:00:00 via INTRAVENOUS

## 2019-07-22 MED ORDER — EPHEDRINE 5 MG/ML INJ
INTRAVENOUS | Status: AC
  Filled 2019-07-22: qty 10

## 2019-07-22 MED ORDER — FLEET ENEMA 7-19 GM/118ML RE ENEM: 1.0000 | ENEMA | Freq: Once | RECTAL | Status: DC | PRN

## 2019-07-22 MED ORDER — SODIUM CHLORIDE 0.9 % IV SOLN
INTRAVENOUS | Status: DC | PRN
  Administered 2019-07-22: 500 mL

## 2019-07-22 MED ORDER — ONDANSETRON HCL 4 MG PO TABS: 4.0000 mg | ORAL_TABLET | Freq: Four times a day (QID) | ORAL | Status: DC | PRN

## 2019-07-22 MED ORDER — THROMBIN (RECOMBINANT) 5000 UNITS EX SOLR
CUTANEOUS | Status: DC | PRN
  Administered 2019-07-22: 15000 [IU] via TOPICAL

## 2019-07-22 MED ORDER — ONDANSETRON HCL 4 MG/2ML IJ SOLN
INTRAMUSCULAR | Status: DC | PRN
  Administered 2019-07-22: 4 mg via INTRAVENOUS

## 2019-07-22 MED ORDER — BUPIVACAINE LIPOSOME 1.3 % IJ SUSP
20.0000 mL | Freq: Once | INTRAMUSCULAR | Status: AC
  Administered 2019-07-22: 20 mL
  Filled 2019-07-22: qty 20

## 2019-07-22 MED ORDER — HYDROCODONE-ACETAMINOPHEN 10-325 MG PO TABS: 2.0000 | ORAL_TABLET | ORAL | Status: DC | PRN

## 2019-07-22 MED ORDER — SODIUM CHLORIDE 0.9 % IV SOLN
INTRAVENOUS | Status: AC
  Filled 2019-07-22: qty 500000

## 2019-07-22 MED ORDER — ACETAMINOPHEN 650 MG RE SUPP: 650.0000 mg | RECTAL | Status: DC | PRN

## 2019-07-22 MED ORDER — LIDOCAINE 2% (20 MG/ML) 5 ML SYRINGE
INTRAMUSCULAR | Status: DC | PRN
  Administered 2019-07-22: 60 mg via INTRAVENOUS

## 2019-07-22 MED ORDER — POLYETHYLENE GLYCOL 3350 17 G PO PACK: 17.0000 g | PACK | Freq: Every day | ORAL | Status: DC | PRN

## 2019-07-22 MED ORDER — BUPIVACAINE-EPINEPHRINE 0.5% -1:200000 IJ SOLN
INTRAMUSCULAR | Status: DC | PRN
  Administered 2019-07-22: 20 mL

## 2019-07-22 MED ORDER — PHENOL 1.4 % MT LIQD: 1.0000 | OROMUCOSAL | Status: DC | PRN

## 2019-07-22 MED ORDER — PROPOFOL 10 MG/ML IV BOLUS
INTRAVENOUS | Status: AC
  Filled 2019-07-22: qty 20

## 2019-07-22 MED ORDER — PROPOFOL 10 MG/ML IV BOLUS
INTRAVENOUS | Status: DC | PRN
  Administered 2019-07-22: 100 mg via INTRAVENOUS

## 2019-07-22 MED ORDER — BUPIVACAINE HCL (PF) 0.25 % IJ SOLN
INTRAMUSCULAR | Status: AC
  Filled 2019-07-22: qty 30

## 2019-07-22 MED ORDER — FENTANYL CITRATE (PF) 100 MCG/2ML IJ SOLN
INTRAMUSCULAR | Status: DC | PRN
  Administered 2019-07-22 (×3): 25 ug via INTRAVENOUS

## 2019-07-22 MED ORDER — HYDROCODONE-ACETAMINOPHEN 5-325 MG PO TABS: 1.0000 | ORAL_TABLET | ORAL | Status: DC | PRN

## 2019-07-22 MED ORDER — CHLORHEXIDINE GLUCONATE 4 % EX LIQD: 60.0000 mL | Freq: Once | CUTANEOUS | Status: DC

## 2019-07-22 MED ORDER — HYDROMORPHONE HCL 1 MG/ML IJ SOLN: 0.5000 mg | INTRAMUSCULAR | Status: DC | PRN

## 2019-07-22 MED ORDER — BENAZEPRIL HCL 20 MG PO TABS: 20.0000 mg | ORAL_TABLET | Freq: Every day | ORAL | Status: DC

## 2019-07-22 MED ORDER — SUGAMMADEX SODIUM 200 MG/2ML IV SOLN
INTRAVENOUS | Status: DC | PRN
  Administered 2019-07-22: 150 mg via INTRAVENOUS

## 2019-07-22 MED ORDER — METHOCARBAMOL 500 MG IVPB - SIMPLE MED
500.0000 mg | Freq: Four times a day (QID) | INTRAVENOUS | Status: DC | PRN
  Filled 2019-07-22: qty 50

## 2019-07-22 MED ORDER — BUPIVACAINE-EPINEPHRINE 0.25% -1:200000 IJ SOLN
INTRAMUSCULAR | Status: AC
  Filled 2019-07-22: qty 1

## 2019-07-22 MED ORDER — BISACODYL 5 MG PO TBEC: 5.0000 mg | DELAYED_RELEASE_TABLET | Freq: Every day | ORAL | Status: DC | PRN

## 2019-07-22 MED ORDER — ACETAMINOPHEN 325 MG PO TABS
650.0000 mg | ORAL_TABLET | ORAL | Status: DC | PRN
  Administered 2019-07-23: 650 mg via ORAL
  Filled 2019-07-22: qty 2

## 2019-07-22 MED ORDER — LACTATED RINGERS IV SOLN
INTRAVENOUS | Status: DC
  Administered 2019-07-23: 02:00:00 via INTRAVENOUS

## 2019-07-22 MED ORDER — FENTANYL CITRATE (PF) 100 MCG/2ML IJ SOLN
INTRAMUSCULAR | Status: AC
  Filled 2019-07-22: qty 2

## 2019-07-22 SURGICAL SUPPLY — 43 items
BAG DECANTER FOR FLEXI CONT (MISCELLANEOUS) ×3 IMPLANT
BAG ZIPLOCK 12X15 (MISCELLANEOUS) IMPLANT
CLEANER TIP ELECTROSURG 2X2 (MISCELLANEOUS) ×3 IMPLANT
CLOSURE WOUND 1/2 X4 (GAUZE/BANDAGES/DRESSINGS) ×1
COVER SURGICAL LIGHT HANDLE (MISCELLANEOUS) ×3 IMPLANT
COVER WAND RF STERILE (DRAPES) IMPLANT
DRAPE MICROSCOPE LEICA (MISCELLANEOUS) ×3 IMPLANT
DRAPE POUCH INSTRU U-SHP 10X18 (DRAPES) ×3 IMPLANT
DRAPE SHEET LG 3/4 BI-LAMINATE (DRAPES) ×3 IMPLANT
DRAPE SURG 17X11 SM STRL (DRAPES) ×3 IMPLANT
DRSG ADAPTIC 3X8 NADH LF (GAUZE/BANDAGES/DRESSINGS) ×3 IMPLANT
DRSG EMULSION OIL 3X16 NADH (GAUZE/BANDAGES/DRESSINGS) ×3 IMPLANT
DRSG PAD ABDOMINAL 8X10 ST (GAUZE/BANDAGES/DRESSINGS) ×3 IMPLANT
DURAPREP 26ML APPLICATOR (WOUND CARE) ×3 IMPLANT
ELECT BLADE TIP CTD 4 INCH (ELECTRODE) ×3 IMPLANT
ELECT REM PT RETURN 15FT ADLT (MISCELLANEOUS) ×3 IMPLANT
GAUZE SPONGE 4X4 12PLY STRL (GAUZE/BANDAGES/DRESSINGS) ×3 IMPLANT
GLOVE BIOGEL PI IND STRL 8.5 (GLOVE) ×1 IMPLANT
GLOVE BIOGEL PI INDICATOR 8.5 (GLOVE) ×2
GLOVE ECLIPSE 8.0 STRL XLNG CF (GLOVE) ×3 IMPLANT
GOWN STRL REUS W/TWL XL LVL3 (GOWN DISPOSABLE) ×3 IMPLANT
HEMOSTAT SPONGE AVITENE ULTRA (HEMOSTASIS) ×3 IMPLANT
KIT BASIN OR (CUSTOM PROCEDURE TRAY) ×3 IMPLANT
KIT TURNOVER KIT A (KITS) IMPLANT
MANIFOLD NEPTUNE II (INSTRUMENTS) ×3 IMPLANT
NEEDLE HYPO 22GX1.5 SAFETY (NEEDLE) ×3 IMPLANT
NEEDLE SPNL 18GX3.5 QUINCKE PK (NEEDLE) ×6 IMPLANT
PACK LAMINECTOMY ORTHO (CUSTOM PROCEDURE TRAY) ×3 IMPLANT
PATTIES SURGICAL .5 X.5 (GAUZE/BANDAGES/DRESSINGS) IMPLANT
PATTIES SURGICAL .75X.75 (GAUZE/BANDAGES/DRESSINGS) ×3 IMPLANT
PATTIES SURGICAL 1X1 (DISPOSABLE) ×3 IMPLANT
PENCIL SMOKE EVACUATOR (MISCELLANEOUS) IMPLANT
SPONGE LAP 4X18 RFD (DISPOSABLE) IMPLANT
STAPLER VISISTAT 35W (STAPLE) ×3 IMPLANT
STRIP CLOSURE SKIN 1/2X4 (GAUZE/BANDAGES/DRESSINGS) ×2 IMPLANT
SUT VIC AB 1 CT1 27 (SUTURE) ×4
SUT VIC AB 1 CT1 27XBRD ANTBC (SUTURE) ×2 IMPLANT
SUT VIC AB 2-0 CT1 27 (SUTURE) ×4
SUT VIC AB 2-0 CT1 TAPERPNT 27 (SUTURE) ×2 IMPLANT
SYR 20ML LL LF (SYRINGE) ×6 IMPLANT
TAPE CLOTH SURG 6X10 WHT LF (GAUZE/BANDAGES/DRESSINGS) ×3 IMPLANT
TOWEL OR 17X26 10 PK STRL BLUE (TOWEL DISPOSABLE) ×3 IMPLANT
TOWEL OR NON WOVEN STRL DISP B (DISPOSABLE) ×3 IMPLANT

## 2019-07-22 NOTE — Interval H&P Note (Signed)
History and Physical Interval Note:  07/22/2019 1:22 PM  Richard Bowen  has presented today for surgery, with the diagnosis of spinal stenosis, bilateral leg weakness, right foot drop.  The various methods of treatment have been discussed with the patient and family. After consideration of risks, benefits and other options for treatment, the patient has consented to  Procedure(s) with comments: Lumbar decompression L4-L5 (N/A) - 120min as a surgical intervention.  The patient's history has been reviewed, patient examined, no change in status, stable for surgery.  I have reviewed the patient's chart and labs.  Questions were answered to the patient's satisfaction.     [name]   

## 2019-07-22 NOTE — Discharge Instructions (Signed)
For the first three days, remove your dressing, and tape a piece of saran wrap over your incision. Take your shower, then remove the saran wrap and put a clean dressing on. °After three days you can shower without the saran wrap.  °No driving while taking pain medications °No lifting or excessive bending.  °Call Dr. Gioffre if any wound complications or temperature of 101 degrees F or over.  °Call the office for an appointment to see Dr. Gioffre in two weeks: [phone] and ask for Dr. Gioffre's nurse, Tammy Johnson.  °

## 2019-07-22 NOTE — Interval H&P Note (Signed)
History and Physical Interval Note:  07/22/2019 1:22 PM  Richard Bowen  has presented today for surgery, with the diagnosis of spinal stenosis, bilateral leg weakness, right foot drop.  The various methods of treatment have been discussed with the patient and family. After consideration of risks, benefits and other options for treatment, the patient has consented to  Procedure(s) with comments: Lumbar decompression L4-L5 (N/A) - 120min as a surgical intervention.  The patient's history has been reviewed, patient examined, no change in status, stable for surgery.  I have reviewed the patient's chart and labs.  Questions were answered to the patient's satisfaction.     Richard Bowen   

## 2019-07-22 NOTE — Progress Notes (Signed)
Pt has white bandage around left forearm. Pt stated "I feel 4 days ago , and my sister in law has been wrapping it for me". NO blood noted on bandage.

## 2019-07-22 NOTE — Anesthesia Preprocedure Evaluation (Signed)

## 2019-07-22 NOTE — H&P (Signed)
Richard Bowen is an 85 y.o. male.   Chief Complaint: low back pain HPI: The patient is a 85 year old male who presented to the office with the chief complaint of low back pain. He developed bilateral posterior thigh pain that did not improve with conservative measures including injection and oral corticosteroid use. CT myelogram showed severe spinal stenosis at L4-L5. Surgery originally canceled because of hypokalemia, then a second time due to hyperkalemia. Patient has since stabilized and was cleared by cardiology.  Past Medical History:  Diagnosis Date  . Anxiety   . Aortic atherosclerosis (HCC)   . Arm pain 03/23/2013  . Arthritis   . Back pain 04/28/2012  . Carotid atherosclerosis 05/08/2011  . Coronary artery disease with history of myocardial infarction without history of CABG 12/25/2010   Coronary Artery Disease  . DDD (degenerative disc disease), lumbar   . Ectatic abdominal aorta (HCC) 06/09/2019   Noted on CT  . GERD (gastroesophageal reflux disease) 06/16/2019  . Grade III diastolic dysfunction 06/17/2019   Restrictive, Noted on ECHO  . Heart murmur   . History of bladder cancer 09/29/2015  . Hyperkalemia   . Hyperlipidemia 06/16/2019  . Hypertension 06/16/2019  . Hypokalemia 09/28/2013  . Incomplete right bundle branch block (RBBB) 06/16/2019   Noted on EKG  . Insomnia 04/28/2012  . Ischial bursitis 06/01/2019  . Lumbar scoliosis   . Lumbar stenosis    Severe L4-L5  . Mitral insufficiency 05/04/2013  . Myocardial infarction [facility]) 2006   Inferior MI  . Neuropathy 04/28/2012  . Peripheral edema 06/09/2017  . Persistent atrial fibrillation (HCC)   . Rupture of diaphragm 03/23/2013  . S/P coronary artery stent placement 05/08/2011  . Sinus bradycardia 01/22/2011  . Wears glasses     Past Surgical History:  Procedure Laterality Date  . APPENDECTOMY    . CARDIAC CATHETERIZATION  2012  . CAROTID STENT    . COLONOSCOPY    . CORONARY ANGIOPLASTY  2012  . CORONARY ANGIOPLASTY  WITH STENT PLACEMENT  2006   RCA  . CORONARY ARTERY BYPASS GRAFT    . HERNIA REPAIR  10/18/2013    Family History  Problem Relation Age of Onset  . Cancer Mother   . Early death Mother   . Diabetes Father   . Stroke Father   . Early death Father   . Early death Sister    Social History:  reports that he quit smoking about 54 years ago. He has never used smokeless tobacco. He reports previous alcohol use. He reports that he does not use drugs.  Allergies:  Allergies  Allergen Reactions  . Rosuvastatin Other (See Comments)    Aches      Current Outpatient Medications:  .  ALPRAZolam (XANAX) 0.5 MG tablet, Take 0.5 mg by mouth at bedtime as needed for anxiety., Disp: , Rfl:  .  aspirin 81 MG chewable tablet, Chew 81 mg by mouth at bedtime. , Disp: , Rfl:  .  benazepril (LOTENSIN) 20 MG tablet, Take 20 mg by mouth daily., Disp: , Rfl:  .  pravastatin (PRAVACHOL) 20 MG tablet, Take 20 mg by mouth at bedtime. , Disp: , Rfl:  .  acetaminophen (TYLENOL) 500 MG tablet, Take 500 mg by mouth as needed. , Disp: , Rfl:  .  Magnesium 400 MG CAPS, Take 400 mg by mouth daily., Disp: 30 capsule, Rfl: 0   Review of Systems  Constitutional: Negative.   HENT: Negative.   Eyes: Negative.  Respiratory: Negative.   Cardiovascular: Negative.   Gastrointestinal: Negative.   Genitourinary: Negative.   Musculoskeletal: Positive for back pain, joint pain and myalgias. Negative for falls and neck pain.  Skin: Negative.   Neurological: Negative.   Endo/Heme/Allergies: Negative.   Psychiatric/Behavioral: Negative.    Physical Exam  Constitutional: He is oriented to person, place, and time. He appears well-developed and well-nourished. No distress.  HENT:  Head: Normocephalic and atraumatic.  Right Ear: External ear normal.  Left Ear: External ear normal.  Nose: Nose normal.  Mouth/Throat: Oropharynx is clear and moist.  Eyes: Conjunctivae and EOM are normal.  Neck: Normal range of motion.  Neck supple.  Cardiovascular: Normal rate, regular rhythm and intact distal pulses.  Murmur heard.  Systolic murmur is present with a grade of 2/6. Respiratory: Effort normal and breath sounds normal. No respiratory distress. He has no wheezes.  GI: Soft. Bowel sounds are normal. He exhibits no distension. There is no abdominal tenderness.  Musculoskeletal:     Right hip: Normal.     Left hip: Normal.     Right knee: Normal.     Left knee: Normal.     Comments: Pain with motion of the lumbar spine with radiation of pain into the legs.   Neurological: He is alert and oriented to person, place, and time. He has normal strength. No sensory deficit.  Skin: No rash noted. He is not diaphoretic. No erythema.  Psychiatric: He has a normal mood and affect. His behavior is normal.   Blood pressure (!) 177/67, pulse 61, temperature 98.2 F (36.8 C), temperature source Oral, resp. rate 16, SpO2 100 %.  Assessment/Plan Lumbar spinal stenosis L4-L5 He needs a lumbar decompression L4-L5. Risks and benefits of the procedure discussed with the patient by Dr. Latanya Maudlin. Will stay overnight for observation.   H&P performed by Dr. Latanya Maudlin Documented by Ardeen Jourdain, PA-C  Johncharles Fusselman Thomasenia Sales, PA-C 07/22/2019, 8:42 AM

## 2019-07-22 NOTE — Brief Op Note (Signed)
07/22/2019  4:12 PM  PATIENT:  Richard Bowen  85 y.o. male  PRE-OPERATIVE DIAGNOSIS:  spinal stenosis,with a complete Block at L-4-L-5, bilateral leg weakness, right and Left  foot drop.Foraminal Stenosis involving the L-4 and L-5 nerve roots bilaterally.  POST-OPERATIVE DIAGNOSIS: Same as Pre-Op  PROCEDURE:  Procedure(s) with comments: Lumbar decompression L4-L5 (N/A) - 11min for a Complete Block.Foraminotomies for the L-4 and L-5 Nerve roots ,bilaterally.Exploration of the L-4-L-5 Disc Space.  SURGEON:  Surgeon(s) and Role:    * Latanya Maudlin, MD - Primary  PHYSICIAN ASSISTANT: Ardeen Jourdain PA  ASSISTANTS: Ardeen Jourdain PA  ANESTHESIA:   general  EBL:  100 mL   BLOOD ADMINISTERED:none  DRAINS: none   LOCAL MEDICATIONS USED:  MARCAINE 20cc of 0.25% with Epinephrine at the start of the case and 20cc of Exparel at the end of the case.    SPECIMEN:  No Specimen  DISPOSITION OF SPECIMEN:  N/A  COUNTS:  YES  TOURNIQUET:  * No tourniquets in log *  DICTATION: .Other Dictation: Dictation Number [id number]  PLAN OF CARE: Admit for overnight observation  PATIENT DISPOSITION:  PACU - hemodynamically stable.   Delay start of Pharmacological VTE agent (>24hrs) due to surgical blood loss or risk of bleeding: yes

## 2019-07-22 NOTE — Anesthesia Procedure Notes (Signed)

## 2019-07-22 NOTE — H&P (View-Only) (Signed)
Richard Bowen is an 85 y.o. male.   Chief Complaint: low back pain HPI: The patient is a 85 year old male who presented to the office with the chief complaint of low back pain. He developed bilateral posterior thigh pain that did not improve with conservative measures including injection and oral corticosteroid use. CT myelogram showed severe spinal stenosis at L4-L5. Surgery originally canceled because of hypokalemia, then a second time due to hyperkalemia. Patient has since stabilized and was cleared by cardiology.  Past Medical History:  Diagnosis Date  . Anxiety   . Aortic atherosclerosis (HCC)   . Arm pain 03/23/2013  . Arthritis   . Back pain 04/28/2012  . Carotid atherosclerosis 05/08/2011  . Coronary artery disease with history of myocardial infarction without history of CABG 12/25/2010   Coronary Artery Disease  . DDD (degenerative disc disease), lumbar   . Ectatic abdominal aorta (HCC) 06/09/2019   Noted on CT  . GERD (gastroesophageal reflux disease) 06/16/2019  . Grade III diastolic dysfunction 06/17/2019   Restrictive, Noted on ECHO  . Heart murmur   . History of bladder cancer 09/29/2015  . Hyperkalemia   . Hyperlipidemia 06/16/2019  . Hypertension 06/16/2019  . Hypokalemia 09/28/2013  . Incomplete right bundle branch block (RBBB) 06/16/2019   Noted on EKG  . Insomnia 04/28/2012  . Ischial bursitis 06/01/2019  . Lumbar scoliosis   . Lumbar stenosis    Severe L4-L5  . Mitral insufficiency 05/04/2013  . Myocardial infarction [facility]) 2006   Inferior MI  . Neuropathy 04/28/2012  . Peripheral edema 06/09/2017  . Persistent atrial fibrillation (HCC)   . Rupture of diaphragm 03/23/2013  . S/P coronary artery stent placement 05/08/2011  . Sinus bradycardia 01/22/2011  . Wears glasses     Past Surgical History:  Procedure Laterality Date  . APPENDECTOMY    . CARDIAC CATHETERIZATION  2012  . CAROTID STENT    . COLONOSCOPY    . CORONARY ANGIOPLASTY  2012  . CORONARY ANGIOPLASTY  WITH STENT PLACEMENT  2006   RCA  . CORONARY ARTERY BYPASS GRAFT    . HERNIA REPAIR  10/18/2013    Family History  Problem Relation Age of Onset  . Cancer Mother   . Early death Mother   . Diabetes Father   . Stroke Father   . Early death Father   . Early death Sister    Social History:  reports that he quit smoking about 54 years ago. He has never used smokeless tobacco. He reports previous alcohol use. He reports that he does not use drugs.  Allergies:  Allergies  Allergen Reactions  . Rosuvastatin Other (See Comments)    Aches      Current Outpatient Medications:  .  ALPRAZolam (XANAX) 0.5 MG tablet, Take 0.5 mg by mouth at bedtime as needed for anxiety., Disp: , Rfl:  .  aspirin 81 MG chewable tablet, Chew 81 mg by mouth at bedtime. , Disp: , Rfl:  .  benazepril (LOTENSIN) 20 MG tablet, Take 20 mg by mouth daily., Disp: , Rfl:  .  pravastatin (PRAVACHOL) 20 MG tablet, Take 20 mg by mouth at bedtime. , Disp: , Rfl:  .  acetaminophen (TYLENOL) 500 MG tablet, Take 500 mg by mouth as needed. , Disp: , Rfl:  .  Magnesium 400 MG CAPS, Take 400 mg by mouth daily., Disp: 30 capsule, Rfl: 0   Review of Systems  Constitutional: Negative.   HENT: Negative.   Eyes: Negative.  Respiratory: Negative.   Cardiovascular: Negative.   Gastrointestinal: Negative.   Genitourinary: Negative.   Musculoskeletal: Positive for back pain, joint pain and myalgias. Negative for falls and neck pain.  Skin: Negative.   Neurological: Negative.   Endo/Heme/Allergies: Negative.   Psychiatric/Behavioral: Negative.    Physical Exam  Constitutional: He is oriented to person, place, and time. He appears well-developed and well-nourished. No distress.  HENT:  Head: Normocephalic and atraumatic.  Right Ear: External ear normal.  Left Ear: External ear normal.  Nose: Nose normal.  Mouth/Throat: Oropharynx is clear and moist.  Eyes: Conjunctivae and EOM are normal.  Neck: Normal range of motion.  Neck supple.  Cardiovascular: Normal rate, regular rhythm and intact distal pulses.  Murmur heard.  Systolic murmur is present with a grade of 2/6. Respiratory: Effort normal and breath sounds normal. No respiratory distress. He has no wheezes.  GI: Soft. Bowel sounds are normal. He exhibits no distension. There is no abdominal tenderness.  Musculoskeletal:     Right hip: Normal.     Left hip: Normal.     Right knee: Normal.     Left knee: Normal.     Comments: Pain with motion of the lumbar spine with radiation of pain into the legs.   Neurological: He is alert and oriented to person, place, and time. He has normal strength. No sensory deficit.  Skin: No rash noted. He is not diaphoretic. No erythema.  Psychiatric: He has a normal mood and affect. His behavior is normal.   Blood pressure (!) 177/67, pulse 61, temperature 98.2 F (36.8 C), temperature source Oral, resp. rate 16, SpO2 100 %.  Assessment/Plan Lumbar spinal stenosis L4-L5 He needs a lumbar decompression L4-L5. Risks and benefits of the procedure discussed with the patient by Dr. Latanya Maudlin. Will stay overnight for observation.   H&P performed by Dr. Latanya Maudlin Documented by Ardeen Jourdain, PA-C  Tyshika Baldridge Thomasenia Sales, PA-C 07/22/2019, 8:42 AM

## 2019-07-22 NOTE — Interval H&P Note (Signed)
History and Physical Interval Note:  07/22/2019 1:22 PM  Richard Bowen  has presented today for surgery, with the diagnosis of spinal stenosis, bilateral leg weakness, right foot drop.  The various methods of treatment have been discussed with the patient and family. After consideration of risks, benefits and other options for treatment, the patient has consented to  Procedure(s) with comments: Lumbar decompression L4-L5 (N/A) - 144min as a surgical intervention.  The patient's history has been reviewed, patient examined, no change in status, stable for surgery.  I have reviewed the patient's chart and labs.  Questions were answered to the patient's satisfaction.     Latanya Maudlin

## 2019-07-22 NOTE — Plan of Care (Signed)

## 2019-07-22 NOTE — Op Note (Signed)
Richard Bowen, MESICK MEDICAL RECORD [id number] ACCOUNT [id number] DATE OF BIRTH:[birth year] FACILITY: [facility] LOCATION: [id number] PHYSICIAN:Neda Willenbring Fransico Setters, MD  OPERATIVE REPORT  DATE OF PROCEDURE:  07/22/2019  SURGEON:  Latanya Maudlin, MD  ASSISTANT:  Ardeen Jourdain, PA.  PREOPERATIVE DIAGNOSES: 1.  Severe spinal stenosis with a complete block at L4-L5. 2.  Exploration of the disk space at L4-L5.   3.  Foraminal stenosis for the L4 and the L5 roots bilaterally.  POSTOPERATIVE DIAGNOSES: 1.  Severe spinal stenosis with a complete block at L4-L5. 2.  Exploration of the disk space at L4-L5.   3.  Foraminal stenosis for the L4 and the L5 roots bilaterally.  OPERATION: 1.  Complete decompressive lumbar laminectomy at L4-L5. 2.  Exploration of the disk space at L4-L5. 3.  Foraminotomies for the L4 and the L5 roots bilaterally.  DESCRIPTION OF PROCEDURE:  Under general anesthesia, routine orthopedic prep and draping of the low back was carried out.  The appropriate time-out was first carried out.  Note, I did not mark the back as it was not necessary since we were going central  and he had bilateral symptoms of his legs.  At this time, 2 needles were placed in the back for localization purposes.  X-ray was taken.  An incision then was made over the L4-L5 space and extended proximally and distally.  Another x-ray was taken with a  Kocher clamp in place.  It was on the spinous process of L3, so we took another x-ray with a Kocher clamp distal at L4.  Once this was established, we then stripped the muscle from the lamina and spinous processes bilaterally.  Following that, bleeders  were identified and cauterized.  The wound was irrigated.  Another x-ray was taken to verify the position at L4-L5.  Following that, the [facility] retractors were inserted and we began our decompression.  I did remove a portion of the spinous process  above at L3 and partial of the S1 distally.  I then went  centrally and removed the entire spinous process of L4, including the lamina.  I did a complete decompression in this area.  The microscope then was brought in.  We then identified the ligamentum  flavum and gradually removed that from the bony structures.  We then protected the dura with cottonoids and I utilized the [place] #4 to free up the ligamentum flavum from the dura.  This was then removed.  I then proceeded to do the decompression  distally and proximally.  Another x-ray was taken with 2 instruments in place.  At this particular time, I then continued the dissection proximally and distally and laterally to decompress the recesses as well.  At this point, I continued the  decompression until I noted that we were free now from the stenotic areas.  Foraminotomies also were done to decompress the L4 and the L5 roots.  The wound was thoroughly irrigated and I loosely applied some thrombin-soaked Gelfoam.  The wound then was  closed in layers in the usual fashion.  I closed the deep portion of the wound with #1 Vicryl.  I left a small distal and proximal deep part of the wound open for drainage purposes, so we did not compress the sac if there was any bleeding.  We initially  injected 20 mL of 0.25% Marcaine with epinephrine in the soft tissue to protect that from bleeding.  At the end of the procedure prior to closing the wound with staples, we injected 20 mL  of Exparel for pain relief.  The wound then was closed with metal  staples.  Sterile dressings were applied.  Prior to surgery, he had 2 g of IV Ancef.  Note, we postponed this case earlier because his potassium was 2.5.  He was later admitted to another hospital because he fell because of the weakness in both legs.   His potassium was 7.  Finally his potassium was reduced back down to 4 prior to the surgery.  The patient will be admitted overnight for pain control.  VN/NUANCE  D:07/22/2019 T:07/22/2019 JOB:[id number]

## 2019-07-22 NOTE — Transfer of Care (Signed)
Immediate Anesthesia Transfer of Care Note  Patient: Richard Bowen  Procedure(s) Performed: Lumbar decompression L4-L5 (N/A )  Patient Location: PACU  Anesthesia Type:General  Level of Consciousness: awake, alert , oriented and patient cooperative  Airway & Oxygen Therapy: Patient Spontanous Breathing and Patient connected to face mask oxygen  Post-op Assessment: Report given to RN, Post -op Vital signs reviewed and stable and Patient moving all extremities  Post vital signs: Reviewed and stable  Last Vitals:  Vitals Value Taken Time  BP 177/54 07/22/19 1625  Temp    Pulse 64 07/22/19 1626  Resp 17 07/22/19 1626  SpO2 100 % 07/22/19 1626  Vitals shown include unvalidated device data.  Last Pain:  Vitals:   07/22/19 1228  TempSrc: Oral      Patients Stated Pain Goal: 4 (60/04/59 9774)  Complications: No apparent anesthesia complications

## 2019-07-22 NOTE — Progress Notes (Signed)
Left forearm has bandage from skin injury from fall 2 weeks ago.

## 2019-07-22 NOTE — Anesthesia Postprocedure Evaluation (Signed)
Anesthesia Post Note  Patient: Richard Bowen  Procedure(s) Performed: Lumbar decompression L4-L5 (N/A )     Patient location during evaluation: PACU Anesthesia Type: General Level of consciousness: awake and alert Pain management: pain level controlled Vital Signs Assessment: post-procedure vital signs reviewed and stable Respiratory status: spontaneous breathing, nonlabored ventilation, respiratory function stable and patient connected to nasal cannula oxygen Cardiovascular status: blood pressure returned to baseline and stable Postop Assessment: no apparent nausea or vomiting Anesthetic complications: no    Last Vitals:  Vitals:   07/22/19 1715 07/22/19 1758  BP: (!) 149/49 (!) 179/66  Pulse: 60 69  Resp: 16 16  Temp: 36.4 C 37 C  SpO2: 100% 100%    Last Pain:  Vitals:   07/22/19 1758  TempSrc: Oral  PainSc:                  Evella Kasal COKER

## 2019-07-23 ENCOUNTER — Encounter (HOSPITAL_COMMUNITY): Payer: Self-pay | Admitting: Orthopedic Surgery

## 2019-07-23 DIAGNOSIS — M48062 Spinal stenosis, lumbar region with neurogenic claudication: Secondary | ICD-10-CM | POA: Diagnosis not present

## 2019-07-23 MED ORDER — HYDROCODONE-ACETAMINOPHEN 5-325 MG PO TABS: 1.0000 | ORAL_TABLET | ORAL | 0 refills | Status: AC | PRN

## 2019-07-23 MED ORDER — METHOCARBAMOL 500 MG PO TABS: 500.0000 mg | ORAL_TABLET | Freq: Four times a day (QID) | ORAL | 0 refills | Status: AC | PRN

## 2019-07-23 NOTE — Progress Notes (Signed)
Subjective: 1 Day Post-Op Procedure(s) (LRB): Lumbar decompression L4-L5 (N/A) Patient reports pain as mild.   Patient seen in rounds with Dr. Lequita Halt. Patient is well, and has had no acute complaints or problems. Reports mild low back soreness. Denies leg pain. Reports that he is voiding well. Positive flatus.  Plan is to go Home after hospital stay.  Objective: Vital signs in last 24 hours: Temp:  [97.6 F (36.4 C)-98.6 F (37 C)] 97.8 F (36.6 C) (12/04 0518) Pulse Rate:  [56-75] 65 (12/04 0518) Resp:  [15-25] 19 (12/04 0518) BP: (130-179)/(48-70) 156/70 (12/04 0518) SpO2:  [98 %-100 %] 98 % (12/04 0518) Weight:  [77.1 kg] 77.1 kg (12/03 1244)  Intake/Output from previous day:  Intake/Output Summary (Last 24 hours) at 07/23/2019 0716 Last data filed at 07/23/2019 0600 Gross per 24 hour  Intake 2630 ml  Output 1300 ml  Net 1330 ml     Labs: Recent Labs    07/22/19 1238  HGB 12.6*   Recent Labs    07/22/19 1238  WBC 7.8  RBC 3.79*  HCT 39.9  PLT 268   Recent Labs    07/22/19 1238  NA 142  K 4.0  CL 108  CO2 24  BUN 29*  CREATININE 1.33*  GLUCOSE 109*  CALCIUM 9.0   Recent Labs    07/22/19 1238  INR 0.9    EXAM General - Patient is Alert and Oriented Extremity - Sensation intact distally Intact pulses distally Dorsiflexion/Plantar flexion intact Left EHL and dorsiflexors 5/5, right EHL and dorsiflexors 4/5 No cellulitis present Compartment soft Dressing/Incision - clean, dry, no drainage Motor Function - intact, moving foot and toes well on exam.   Past Medical History:  Diagnosis Date  . Anxiety   . Aortic atherosclerosis (HCC)   . Arm pain 03/23/2013  . Arthritis   . Back pain 04/28/2012  . Carotid atherosclerosis 05/08/2011  . Coronary artery disease with history of myocardial infarction without history of CABG 12/25/2010   Coronary Artery Disease  . DDD (degenerative disc disease), lumbar   . Ectatic abdominal aorta (HCC) 06/09/2019    Noted on CT  . GERD (gastroesophageal reflux disease) 06/16/2019  . Grade III diastolic dysfunction 06/17/2019   Restrictive, Noted on ECHO  . Heart murmur   . History of bladder cancer 09/29/2015  . Hyperkalemia   . Hyperlipidemia 06/16/2019  . Hypertension 06/16/2019  . Hypokalemia 09/28/2013  . Incomplete right bundle branch block (RBBB) 06/16/2019   Noted on EKG  . Insomnia 04/28/2012  . Ischial bursitis 06/01/2019  . Lumbar scoliosis   . Lumbar stenosis    Severe L4-L5  . Mitral insufficiency 05/04/2013  . Myocardial infarction [facility]) 2006   Inferior MI  . Neuropathy 04/28/2012  . Peripheral edema 06/09/2017  . Persistent atrial fibrillation (HCC)   . Rupture of diaphragm 03/23/2013  . S/P coronary artery stent placement 05/08/2011  . Sinus bradycardia 01/22/2011  . Wears glasses     Assessment/Plan: 1 Day Post-Op Procedure(s) (LRB): Lumbar decompression L4-L5 (N/A) Active Problems:   Spinal stenosis, lumbar region with neurogenic claudication  Estimated body mass index is 24.39 kg/m as calculated from the following:   Height as of this encounter: 5\' 10"  (1.778 m).   Weight as of this encounter: 77.1 kg. Advance diet Up with therapy D/C IV fluids when tolerating POs well  DVT Prophylaxis - Aspirin Weight-Bearing as tolerated   We will have him get up with therapy today. Plan for DC home  if he is meeting his goals. Follow up in office 07/29/19. Discharge instructions discussed. Left forearm wound is stable at this point. Likely should have been sutured originally but edges would be too strained to bring together now. Recommend continue daily dressing change. RN to change surgical dressing once the patient is up.   Ardeen Jourdain, PA-C Orthopaedic Surgery 07/23/2019, 7:16 AM

## 2019-07-23 NOTE — Progress Notes (Signed)
Initial Nutrition Assessment  DOCUMENTATION CODES:   Not applicable  INTERVENTION:  - will monitor for needs if patient unable to discharge today.    NUTRITION DIAGNOSIS:   Increased nutrient needs related to post-op healing as evidenced by estimated needs.  GOAL:   Patient will meet greater than or equal to 90% of their needs  MONITOR:   PO intake, Supplement acceptance, Labs, Weight trends, Skin  REASON FOR ASSESSMENT:   Malnutrition Screening Tool  ASSESSMENT:   85 y.o. male with medical hx of arthritis, CAD, atherosclerosis, GERD, HTN, hyperlipidemia, mitral insufficiency, neuropathy, and hx of bladder cancer. He presented to the ED with low back pain and bilateral thigh pain; not improved with injections or oral corticosteroid use. CT showed severe spinal stenosis to L4-L5.  Flow sheet documentation indicates that patient is a/o to self only. He consumed 100% of breakfast this AM (cheerios with whole milk and a cup of orange juice--204 kcal, 6 grams protein). Patient is POD #1 decompression. Discharge order for discharge home entered earlier this AM; discharge summary not yet entered.    Labs reviewed; BUN: 29 mg/dl, creatinine: 1.33 mg/dl, GFR: 48 ml/min. Medications reviewed. IVF; LR @ 100 ml/hr.     NUTRITION - FOCUSED PHYSICAL EXAM:  completed; no muscle or fat wasting.   Diet Order:   Diet Order            Diet - low sodium heart healthy        Diet regular Room service appropriate? Yes; Fluid consistency: Thin  Diet effective now              EDUCATION NEEDS:   No education needs have been identified at this time  Skin:  Skin Assessment: Skin Integrity Issues: Skin Integrity Issues:: Incisions Incisions: back (12/3)  Last BM:  11/30  Height:   Ht Readings from Last 1 Encounters:  07/22/19 5\' 10"  (1.778 m)    Weight:   Wt Readings from Last 1 Encounters:  07/22/19 77.1 kg    Ideal Body Weight:  75.4 kg  BMI:  Body mass index is  24.39 kg/m.  Estimated Nutritional Needs:   Kcal:  1800-2050 kcal  Protein:  80-90 grams  Fluid:  >/= 2 L/day       Jarome Matin, MS, RD, LDN, [facility] Inpatient Clinical Dietitian Pager # [phone] After hours/weekend pager # [phone]

## 2019-07-23 NOTE — TOC Transition Note (Signed)
Transition of Care [facility]) - CM/SW Discharge Note   Patient Details  Name: Richard Bowen MRN: [id number] Date of Birth: 1932-04-07  [facility] CM/SW Contact:  Lia Hopping, [place] Phone Number: 07/23/2019, 1:46 PM   Clinical Narrative:    Orders placed for RW and 3 IN 1. [employer] to deliver to the patient room.    Final next level of care: Home/Self Care Barriers to Discharge: No Barriers Identified   Patient Goals and CMS Choice        Discharge Placement                       Discharge Plan and Services                DME Arranged: 3-N-1, Walker rolling DME Agency: [employer] Date DME Agency Contacted: 07/23/19 Time DME Agency Contacted: 53 Representative spoke with at DME Agency: [place] Interventions     Readmission Risk Interventions No flowsheet data found.

## 2019-07-23 NOTE — Evaluation (Signed)
Occupational Therapy Evaluation Patient Details Name: Richard Bowen MRN: [id number] DOB: 07-12-1932 Today's Date: 07/23/2019    History of Present Illness 85 y o man s/p L4-5 decompression 2*spinal stenosis   Clinical Impression   Pt was admitted for the above.  Prior to admission, pt was independent with adls and lives alone. Will follow in acute setting with supervision level goals.  Recommend 24/7 vs snf for safety as pt is impulsive, doesn't follow back precautions and had a posterior LOB when standing at sink.      Follow Up Recommendations  SNF;Supervision/Assistance - 24 hour    Equipment Recommendations  3 in 1 bedside commode    Recommendations for Other Services       Precautions / Restrictions Precautions Precautions: Back Restrictions Weight Bearing Restrictions: No      Mobility Bed Mobility               General bed mobility comments: oob  Transfers Overall transfer level: Needs assistance Equipment used: Rolling walker (2 wheeled) Transfers: Sit to/from Stand Sit to Stand: Min guard         General transfer comment: for safety; cues for UE placement and to keep back straight    Balance                                           ADL either performed or assessed with clinical judgement   ADL Overall ADL's : Needs assistance/impaired Eating/Feeding: Independent   Grooming: Oral care;Minimal assistance;Standing;Min guard Grooming Details (indicate cue type and reason): pt lost balance standing at sink requiring assist to avoid fall; cues for back precautions Upper Body Bathing: Supervision/ safety   Lower Body Bathing: Moderate assistance   Upper Body Dressing : Supervision/safety   Lower Body Dressing: Moderate assistance;Sit to/from stand Lower Body Dressing Details (indicate cue type and reason): simulated pants with reacher; he has one but doesn't know where it is Toilet Transfer: Minimal assistance Toilet Transfer  Details (indicate cue type and reason): steadying assistance; cues for safety and back precautions Toileting- Clothing Manipulation and Hygiene: Minimal assistance         General ADL Comments: reviewed back precautions.  Performed standing toileting, stood for oral care and washing hands.  Practiced with reacher and pillow case as pants. Pt did not want to wash today     Vision         Perception     Praxis      Pertinent Vitals/Pain Pain Assessment: Faces Faces Pain Scale: Hurts even more Pain Location: back Pain Descriptors / Indicators: Aching Pain Intervention(s): Limited activity within patient's tolerance;Monitored during session;Premedicated before session;Repositioned     Hand Dominance     Extremity/Trunk Assessment Upper Extremity Assessment Upper Extremity Assessment: Generalized weakness           Communication Communication Communication: No difficulties(difficult to hear therapist with mask)   Cognition Arousal/Alertness: Awake/alert Behavior During Therapy: Impulsive Overall Cognitive Status: No family/caregiver present to determine baseline cognitive functioning                                 General Comments: did not recall back precautions from earlier   General Comments       Exercises     Shoulder Instructions      Home Living Family/patient expects  to be discharged to:: Private residence Living Arrangements: Alone                 Bathroom Shower/Tub: Chief Strategy Officer: Handicapped height         Additional Comments: has a Sports administrator,  but doesn't know where it is      Prior Functioning/Environment Level of Independence: Independent                 OT Problem List: Decreased knowledge of precautions;Decreased safety awareness;Impaired balance (sitting and/or standing);Decreased knowledge of use of DME or AE;Pain;Decreased activity tolerance      OT Treatment/Interventions:  Self-care/ADL training;Energy conservation;DME and/or AE instruction;Balance training;Patient/family education;Therapeutic activities;Cognitive remediation/compensation    OT Goals(Current goals can be found in the care plan section) Acute Rehab OT Goals OT Goal Formulation: With patient Time For Goal Achievement: 07/30/19 Potential to Achieve Goals: Good ADL Goals Pt Will Transfer to Toilet: with supervision;ambulating(including hygiene) Pt Will Perform Tub/Shower Transfer: Tub transfer;ambulating;tub bench;with min guard assist Additional ADL Goal #1: pt will perform adl with set up/supervision, AE as needed sit to stand  OT Frequency: Min 2X/week   Barriers to D/C: Decreased caregiver support          Co-evaluation              AM-PAC OT "6 Clicks" Daily Activity     Outcome Measure Help from another person eating meals?: None Help from another person taking care of personal grooming?: A Little Help from another person toileting, which includes using toliet, bedpan, or urinal?: A Little Help from another person bathing (including washing, rinsing, drying)?: A Lot Help from another person to put on and taking off regular upper body clothing?: A Little Help from another person to put on and taking off regular lower body clothing?: A Lot 6 Click Score: 17   End of Session    Activity Tolerance: Patient tolerated treatment well Patient left: in chair;with call bell/phone within reach;with chair alarm set  OT Visit Diagnosis: Muscle weakness (generalized) (M62.81)                Time: 5701-7793 OT Time Calculation (min): 13 min Charges:  OT General Charges $OT Visit: 1 Visit OT Evaluation $OT Eval Low Complexity: 1 Low  Richard Bowen, OTR/L Acute Rehabilitation Services [phone] [facility] pager [phone] office 07/23/2019  Richard Bowen 07/23/2019, 10:19 AM

## 2019-07-23 NOTE — Progress Notes (Signed)
   07/23/19 1200  OT Visit Information  Last OT Received On 07/23/19  Assistance Needed +1  History of Present Illness 85 y o man s/p L4-5 decompression 2*spinal stenosis  Precautions  Precautions Back  Precaution Comments Back precautions reviewed x 2  Pain Assessment  Faces Pain Scale 4  Pain Location back  Pain Descriptors / Indicators Aching;Sore  Pain Intervention(s) Limited activity within patient's tolerance;Monitored during session;Repositioned  Cognition  Arousal/Alertness Awake/alert  Behavior During Therapy Impulsive  Overall Cognitive Status No family/caregiver present to determine baseline cognitive functioning  General Comments poor recall of precautions, decreased safety  ADL  Toilet Transfer Minimal assistance;Ambulation;RW (standing in front of toilet)  General ADL Comments Pt calling for assistance for toileting. unable to void this time.  Pt with decreased safety with RW, attempted to "park it", lift it, and ran into door frame x 2.  He did not do this earlier.    Transfers  Equipment used Rolling walker (2 wheeled)  Sit to [employer] transfer comment for safety; cues for UE placement and to keep back straight  OT - End of Session  Activity Tolerance Patient tolerated treatment well  Patient left in chair;with call bell/phone within reach;with chair alarm set (at pt's request)  OT Assessment/Plan  OT Visit Diagnosis Muscle weakness (generalized) (M62.81)  OT Frequency (ACUTE ONLY) Min 2X/week  Follow Up Recommendations SNF;Supervision/Assistance - 24 hour  OT Equipment 3 in 1 bedside commode  AM-PAC OT "6 Clicks" Daily Activity Outcome Measure (Version 2)  Help from another person eating meals? 4  Help from another person taking care of personal grooming? 3  Help from another person toileting, which includes using toliet, bedpan, or urinal? 3  Help from another person bathing (including washing, rinsing, drying)? 2  Help from another person to  put on and taking off regular upper body clothing? 3  Help from another person to put on and taking off regular lower body clothing? 2  6 Click Score 17  OT Goal Progression  Progress towards OT goals Not progressing toward goals - comment (continues to need a lot of cues for back precaution/safety)  OT Time Calculation  OT Start Time (ACUTE ONLY) 1116  OT Stop Time (ACUTE ONLY) 1124  OT Time Calculation (min) 8 min  OT General Charges  $OT Visit 1 Visit  OT Treatments  $Self Care/Home Management  8-22 mins  Lesle Chris, OTR/L Acute Rehabilitation Services [phone] [facility] pager [phone] office 07/23/2019

## 2019-07-23 NOTE — Progress Notes (Signed)
Physical Therapy Treatment Patient Details Name: Richard Bowen MRN: [id number] DOB: 13-May-1932 Today's Date: 07/23/2019    History of Present Illness 85 y o man s/p L4-5 decompression 2*spinal stenosis    PT Comments    Pt continues very cooperative but impulsive and with limited safety awareness and follow through with back precautions despite multiple reviews and written instruction.  Pt continues fall risk with ambulation requiring constant cues for safe use of RW and physical assist to maintain balance sans AD.        Follow Up Recommendations  SNF;Supervision/Assistance - 24 hour     Equipment Recommendations  None recommended by PT    Recommendations for Other Services OT consult     Precautions / Restrictions Precautions Precautions: Back Precaution Comments: Back precautions reviewed x 2 Restrictions Weight Bearing Restrictions: No    Mobility  Bed Mobility Overal bed mobility: Needs Assistance Bed Mobility: Supine to Sit;Sit to Supine     Supine to sit: Min guard Sit to supine: Min guard   General bed mobility comments: Performed in/out of bed x 3 with cues for technique and adherence to back precautions  Transfers Overall transfer level: Needs assistance Equipment used: Rolling walker (2 wheeled) Transfers: Sit to/from Stand Sit to Stand: Min guard         General transfer comment: for safety; cues for UE placement and to keep back straight  Ambulation/Gait Ambulation/Gait assistance: Min assist;Min guard Gait Distance (Feet): 200 Feet(and 15' into bathroom) Assistive device: Rolling walker (2 wheeled);1 person hand held assist Gait Pattern/deviations: Step-through pattern;Decreased step length - right;Decreased step length - left;Shuffle Gait velocity: cues to slow for safety   General Gait Details: Pt ambulated with RW and frequence cues for safety, posture and position from RW.  Attempted ambulation sans AD but pt with marked increased in  instability and one LOB requiring assist to recover   Stairs Stairs: Yes Stairs assistance: Min guard Stair Management: Two rails;Forwards;Step to pattern;Alternating pattern Number of Stairs: 5 General stair comments: cues for sequence; steady assist   Wheelchair Mobility    Modified Rankin (Stroke Patients Only)       Balance Overall balance assessment: Needs assistance Sitting-balance support: No upper extremity supported;Feet supported Sitting balance-Leahy Scale: Fair     Standing balance support: No upper extremity supported Standing balance-Leahy Scale: Poor                              Cognition Arousal/Alertness: Awake/alert Behavior During Therapy: Impulsive Overall Cognitive Status: No family/caregiver present to determine baseline cognitive functioning                                 General Comments: poor recall of precautions, decreased safety      Exercises      General Comments        Pertinent Vitals/Pain Pain Assessment: 0-10 Pain Score: 3  Faces Pain Scale: Hurts little more Pain Location: back Pain Descriptors / Indicators: Aching;Sore Pain Intervention(s): Limited activity within patient's tolerance;Monitored during session;Premedicated before session;Ice applied    Home Living                      Prior Function            PT Goals (current goals can now be found in the care plan section) Acute Rehab PT Goals Patient  Stated Goal: Regain IND PT Goal Formulation: With patient Time For Goal Achievement: 08/06/19 Potential to Achieve Goals: Fair Progress towards PT goals: Progressing toward goals    Frequency    7X/week      PT Plan Current plan remains appropriate    Co-evaluation              AM-PAC PT "6 Clicks" Mobility   Outcome Measure  Help needed turning from your back to your side while in a flat bed without using bedrails?: A Little Help needed moving from lying on  your back to sitting on the side of a flat bed without using bedrails?: A Little Help needed moving to and from a bed to a chair (including a wheelchair)?: A Little Help needed standing up from a chair using your arms (e.g., wheelchair or bedside chair)?: A Little Help needed to walk in hospital room?: A Little Help needed climbing 3-5 steps with a railing? : A Little 6 Click Score: 18    End of Session Equipment Utilized During Treatment: Gait belt Activity Tolerance: Patient tolerated treatment well Patient left: in bed;with call bell/phone within reach;with bed alarm set Nurse Communication: Mobility status PT Visit Diagnosis: Difficulty in walking, not elsewhere classified (R26.2);Unsteadiness on feet (R26.81)     Time: 0093-8182 PT Time Calculation (min) (ACUTE ONLY): 30 min  Charges:  $Gait Training: 8-22 mins $Therapeutic Activity: 8-22 mins                     Mauro Kaufmann PT Acute Rehabilitation Services Pager [phone] Office [phone]    Wyolene Weimann 07/23/2019, 3:01 PM

## 2019-07-23 NOTE — Evaluation (Signed)
Physical Therapy Evaluation Patient Details Name: Richard Bowen MRN: [id number] DOB: Jan 07, 1932 Today's Date: 07/23/2019   History of Present Illness  36 y o man s/p L4-5 decompression 2*spinal stenosis  Clinical Impression  Pt was admitted for the above.  Prior to admission, pt was independent with mobility and lives alone. Will follow in acute setting with supervision level goals.  Recommend 24/7 vs snf for safety as pt is impulsive, doesn't follow back precautions, was unsafe ambulating with RW and had a LOB when ambulating sans AD this am.    Follow Up Recommendations SNF;Supervision/Assistance - 24 hour    Equipment Recommendations  None recommended by PT    Recommendations for Other Services OT consult     Precautions / Restrictions Precautions Precautions: Back Precaution Booklet Issued: Yes (comment) Precaution Comments: Back precautions reviewed x 2 Restrictions Weight Bearing Restrictions: No      Mobility  Bed Mobility Overal bed mobility: Needs Assistance Bed Mobility: Supine to Sit     Supine to sit: Min guard     General bed mobility comments: cues for technique and adherence to back precautions  Transfers Overall transfer level: Needs assistance Equipment used: Rolling walker (2 wheeled) Transfers: Sit to/from Stand Sit to Stand: Min guard         General transfer comment: for safety; cues for UE placement and to keep back straight  Ambulation/Gait Ambulation/Gait assistance: Min assist;Min guard Gait Distance (Feet): 250 Feet Assistive device: Rolling walker (2 wheeled);1 person hand held assist Gait Pattern/deviations: Step-through pattern;Decreased step length - right;Decreased step length - left;Shuffle     General Gait Details: Pt ambulated with RW and frequence cues for posture and position from RW.  Attempted ambulation sans AD but pt with marked increased in instability and one LOB requiring assist to recover  Stairs             Wheelchair Mobility    Modified Rankin (Stroke Patients Only)       Balance Overall balance assessment: Needs assistance Sitting-balance support: No upper extremity supported;Feet supported Sitting balance-Leahy Scale: Fair     Standing balance support: No upper extremity supported Standing balance-Leahy Scale: Poor                               Pertinent Vitals/Pain Pain Assessment: Faces Faces Pain Scale: Hurts little more Pain Location: back Pain Descriptors / Indicators: Aching;Sore Pain Intervention(s): Limited activity within patient's tolerance;Monitored during session    [place] expects to be discharged to:: Private residence Living Arrangements: Alone Available Help at Discharge: Family Type of Home: House Home Access: Stairs to enter Entrance Stairs-Rails: Right Entrance Stairs-Number of Steps: 3   Home Equipment: Environmental consultant - 2 wheels Additional Comments: has a Secondary school teacher,  but doesn't know where it is    Prior Function Level of Independence: Independent               Hand Dominance        Extremity/Trunk Assessment   Upper Extremity Assessment Upper Extremity Assessment: Defer to OT evaluation    Lower Extremity Assessment Lower Extremity Assessment: Generalized weakness       Communication   Communication: No difficulties  Cognition Arousal/Alertness: Awake/alert Behavior During Therapy: Impulsive Overall Cognitive Status: No family/caregiver present to determine baseline cognitive functioning  General Comments: No recall of back precautions at end of session      General Comments      Exercises     Assessment/Plan    PT Assessment Patient needs continued PT services  PT Problem List Decreased strength;Decreased range of motion;Decreased activity tolerance;Decreased balance;Decreased mobility;Decreased cognition;Decreased knowledge of use of  DME;Pain;Decreased knowledge of precautions;Decreased safety awareness       PT Treatment Interventions DME instruction;Gait training;Stair training;Functional mobility training;Therapeutic activities;Therapeutic exercise;Patient/family education;Balance training    PT Goals (Current goals can be found in the Care Plan section)  Acute Rehab PT Goals Patient Stated Goal: Regain IND PT Goal Formulation: With patient Time For Goal Achievement: 08/06/19 Potential to Achieve Goals: Fair    Frequency 7X/week   Barriers to discharge Decreased caregiver support Pt is home alone    Co-evaluation               AM-PAC PT "6 Clicks" Mobility  Outcome Measure Help needed turning from your back to your side while in a flat bed without using bedrails?: A Little Help needed moving from lying on your back to sitting on the side of a flat bed without using bedrails?: A Little Help needed moving to and from a bed to a chair (including a wheelchair)?: A Little Help needed standing up from a chair using your arms (e.g., wheelchair or bedside chair)?: A Little Help needed to walk in hospital room?: A Little Help needed climbing 3-5 steps with a railing? : A Little 6 Click Score: 18    End of Session Equipment Utilized During Treatment: Gait belt Activity Tolerance: Patient tolerated treatment well Patient left: in chair;with call bell/phone within reach;with chair alarm set Nurse Communication: Mobility status PT Visit Diagnosis: Difficulty in walking, not elsewhere classified (R26.2);Unsteadiness on feet (R26.81)    Time: 5176-1607 PT Time Calculation (min) (ACUTE ONLY): 20 min   Charges:   PT Evaluation $PT Eval Low Complexity: 1 Low          Mauro Kaufmann PT Acute Rehabilitation Services Pager [phone] Office [phone]   Chantia Amalfitano 07/23/2019, 11:15 AM

## 2019-07-26 NOTE — Discharge Summary (Signed)
Physician Discharge Summary   Patient ID: Chrsitopher Bowen MRN: [id number] DOB/AGE: Jul 22, 1932 85 y.o.  Admit date: 07/22/2019 Discharge date: 07/26/2019  Primary Diagnosis:  Spinal stenosis, bilateral leg weakness, right foot drop  Admission Diagnoses:  Past Medical History:  Diagnosis Date  . Anxiety   . Aortic atherosclerosis ([place])   . Arm pain 03/23/2013  . Arthritis   . Back pain 04/28/2012  . Carotid atherosclerosis 05/08/2011  . Coronary artery disease with history of myocardial infarction without history of CABG 12/25/2010   Coronary Artery Disease  . DDD (degenerative disc disease), lumbar   . Ectatic abdominal aorta ([place]) 06/09/2019   Noted on CT  . GERD (gastroesophageal reflux disease) 06/16/2019  . Grade III diastolic dysfunction 83/41/9622   Restrictive, Noted on ECHO  . Heart murmur   . History of bladder cancer 09/29/2015  . Hyperkalemia   . Hyperlipidemia 06/16/2019  . Hypertension 06/16/2019  . Hypokalemia 09/28/2013  . Incomplete right bundle branch block (RBBB) 06/16/2019   Noted on EKG  . Insomnia 04/28/2012  . Ischial bursitis 06/01/2019  . Lumbar scoliosis   . Lumbar stenosis    Severe L4-L5  . Mitral insufficiency 05/04/2013  . Myocardial infarction [facility] 2006   Inferior MI  . Neuropathy 04/28/2012  . Peripheral edema 06/09/2017  . Persistent atrial fibrillation ([place])   . Rupture of diaphragm 03/23/2013  . S/P coronary artery stent placement 05/08/2011  . Sinus bradycardia 01/22/2011  . Wears glasses    Discharge Diagnoses:   Active Problems:   Spinal stenosis, lumbar region with neurogenic claudication  Procedure:  Procedure(s) (LRB): Lumbar decompression L4-L5 (N/A)   Consults: None  HPI: The patient is a 85 year old male who presented to the office with the chief complaint of low back pain. He developed bilateral posterior thigh pain that did not improve with conservative measures including injection and oral corticosteroid use. CT myelogram showed  severe spinal stenosis at L4-L5.Surgery originally canceled because of hypokalemia, then a second time due to hyperkalemia. Patient has since stabilized and was cleared by cardiology.   Laboratory Data: Hospital Outpatient Visit on 07/21/2019  Component Date Value Ref Range Status  . SARS Coronavirus 2 07/21/2019 NEGATIVE  NEGATIVE Final   Comment: (NOTE) SARS-CoV-2 target nucleic acids are NOT DETECTED. The SARS-CoV-2 RNA is generally detectable in upper and lower respiratory specimens during the acute phase of infection. Negative results do not preclude SARS-CoV-2 infection, do not rule out co-infections with other pathogens, and should not be used as the sole basis for treatment or other patient management decisions. Negative results must be combined with clinical observations, patient history, and epidemiological information. The expected result is Negative. Fact Sheet for Patients: SugarRoll.be Fact Sheet for Healthcare Providers: https://www.woods-mathews.com/ This test is not yet approved or cleared by the [place] FDA and  has been authorized for detection and/or diagnosis of SARS-CoV-2 by FDA under an Emergency Use Authorization (EUA). This EUA will remain  in effect (meaning this test can be used) for the duration of the COVID-19 declaration under Section 56                          4(b)(1) of the Act, 21 U.S.C. section 360bbb-3(b)(1), unless the authorization is terminated or revoked sooner. Performed at [facility], [place] [address] [place], [place] [address]     X-Rays:Dg Lumbar Spine 2-3 Views  Result Date: 06/29/2019 CLINICAL DATA:  Preop lumbar spine surgery. EXAM:  LUMBAR SPINE - 2-3 VIEW COMPARISON:  None. FINDINGS: The lumbar spine levels have been annotated. Again seen is a scoliosis deformity which is convex towards the left. Multi level disc space narrowing and endplate spurring is identified. This is most  advanced at L2-3, L3-4, and L5-S1. Aortic atherosclerosis. IMPRESSION: Lumbar scoliosis and degenerative disc disease. Electronically Signed   By: Signa Kellaylor  Stroud M.D.   On: 06/29/2019 16:30   Dg Spine Portable 1 View  Result Date: 07/22/2019 CLINICAL DATA:  Lumbar spine surgery. EXAM: PORTABLE SPINE - 1 VIEW COMPARISON:  Prior study same day. FINDINGS: Lumbar spine numbered as per prior exam. Posterior metallic markers noted crossing the L4-L5 disc space level posteriorly. Degenerative changes lumbar spine. No acute bony abnormality. IMPRESSION: Postsurgical changes lumbar spine. Electronically Signed   By: Maisie Fushomas  Register   On: 07/22/2019 15:47   Dg Spine Portable 1 View  Result Date: 07/22/2019 CLINICAL DATA:  Elective lumbar surgery. EXAM: PORTABLE SPINE - 1 VIEW COMPARISON:  June 09, 2019. FINDINGS: Single intraoperative cross-table lateral projection was obtained of the lumbar spine. Surgical probe is seen at approximately the L4-5 level. IMPRESSION: Surgical localization as described above. Electronically Signed   By: Lupita RaiderJames  Green Jr M.D.   On: 07/22/2019 15:25   Dg Spine Portable 1 View  Result Date: 07/22/2019 CLINICAL DATA:  Elective lumbar surgery. EXAM: PORTABLE SPINE - 1 VIEW COMPARISON:  June 29, 2019.  June 09, 2019. FINDINGS: Single intraoperative cross-table lateral projection was obtained of the lumbar spine. This demonstrates surgical probe directed toward the posterior spinous process of L4. IMPRESSION: Surgical localization as described above. Electronically Signed   By: Lupita RaiderJames  Green Jr M.D.   On: 07/22/2019 15:14   Dg Spine Portable 1 View  Result Date: 07/22/2019 CLINICAL DATA:  Intraoperative localization. EXAM: PORTABLE SPINE - 1 VIEW COMPARISON:  07/22/2019 FINDINGS: Single lateral view in the operating room was obtained. Surgical instrument on the lower margin of the L3 spinous process. Sponges in the soft tissues. Lowest disc space considered L5-S1. IMPRESSION:  Lower L3 spinous process localized in the operating room. Electronically Signed   By: Marlan Palauharles  Clark M.D.   On: 07/22/2019 15:02   Dg Spine Portable 1 View  Result Date: 07/22/2019 CLINICAL DATA:  Intraoperative placement. Please number vertebra. EXAM: PORTABLE SPINE - 1 VIEW COMPARISON:  Radiograph 06/29/2019 FINDINGS: Single lateral view of the lumbar spine provided. Surgical instruments localize posterior to L4-L5 and L5-S1. Spinal numbering labeled, same numbering system as on prior exam. IMPRESSION: Surgical instruments localize posterior to L4-L5 and L5-S1. Electronically Signed   By: Narda RutherfordMelanie  Sanford M.D.   On: 07/22/2019 14:39    EKG: Orders placed or performed in visit on 06/16/19  . EKG 12-Lead     Hospital Course: Patient was [facility] and taken to the OR and underwent the above state procedure without complications.  Patient tolerated the procedure well and was later transferred to the recovery room and then to the orthopaedic floor for postoperative care.  They were given PO and IV analgesics for pain control following their surgery.  They were given 24 hours of postoperative antibiotics.   PT was consulted postop to assist with mobility and transfers.  The patient was allowed to be WBAT with therapy and was taught back precautions. Discharge planning was consulted to help with postop disposition and equipment needs.  Patient had a fair night on the evening of surgery and started to get up OOB with therapy on day  one. Patient was seen in rounds and was ready to go home on day one.  They were given discharge instructions and dressing directions.  They were instructed on when to follow up in the office with Dr. Darrelyn Hillock.   Diet: Cardiac diet Activity:WBAT Follow-up:in 1 week Disposition - Home Discharged Condition: stable   Discharge Instructions    Call MD / Call 911   Complete by: As directed    If you experience chest pain or shortness of breath, CALL 911  and be transported to the hospital emergency room.  If you develope a fever above 101 F, pus (white drainage) or increased drainage or redness at the wound, or calf pain, call your surgeon's office.   Constipation Prevention   Complete by: As directed    Drink plenty of fluids.  Prune juice may be helpful.  You may use a stool softener, such as Colace (over the counter) 100 mg twice a day.  Use MiraLax (over the counter) for constipation as needed.   Diet - low sodium heart healthy   Complete by: As directed    Discharge instructions   Complete by: As directed    For the first three days, remove your dressing, and tape a piece of saran wrap over your incision Take your shower, then remove the saran wrap and put a clean dressing on After three days you can shower without the saran wrap.  No driving while taking pain medications No lifting or excessive bending.  Call Dr. Darrelyn Hillock if any wound complications or temperature of 101 degrees F or over.  Call the office for an appointment to see Dr. Darrelyn Hillock in two weeks: [phone] and ask for Dr. Jeannetta Ellis nurse, [facility] [place].   Increase activity slowly as tolerated   Complete by: As directed      Allergies as of 07/23/2019      Reactions   Rosuvastatin Other (See Comments)   Aches      Medication List    TAKE these medications   acetaminophen 500 MG tablet Commonly known as: TYLENOL Take 500 mg by mouth as needed.   ALPRAZolam 0.5 MG tablet Commonly known as: XANAX Take 0.5 mg by mouth at bedtime as needed for anxiety.   aspirin 81 MG chewable tablet Chew 81 mg by mouth at bedtime.   benazepril 20 MG tablet Commonly known as: LOTENSIN Take 20 mg by mouth daily.   HYDROcodone-acetaminophen 5-325 MG tablet Commonly known as: NORCO/VICODIN Take 1 tablet by mouth every 4 (four) hours as needed for moderate pain ((score 4 to 6)).   Magnesium 400 MG Caps Take 400 mg by mouth daily.   methocarbamol 500 MG tablet Commonly known  as: ROBAXIN Take 1 tablet (500 mg total) by mouth every 6 (six) hours as needed for muscle spasms.   pravastatin 20 MG tablet Commonly known as: PRAVACHOL Take 20 mg by mouth at bedtime.      Follow-up Information    Ranee Gosselin, MD. Schedule an appointment as soon as possible for a visit on 07/29/2019.   Specialty: Orthopedic Surgery Contact information: [address] [place] [address] [phone]           Signed: Dimitri Ped, PA-C Orthopaedic Surgery 07/26/2019, 7:08 AM

## 2019-10-04 ENCOUNTER — Ambulatory Visit: Payer: Medicare HMO | Admitting: Cardiology

## 2020-02-17 DEATH — deceased

## 2021-02-20 IMAGING — XA DG MYELOGRAPHY LUMBAR INJ LUMBOSACRAL
12 of 14 series · 12 of 14 positions shown · non-contrast
Comparison: None.

CLINICAL DATA: Bilateral posterior thigh pain and leg weakness for
the past several weeks. Denies back pain. No prior surgery.
TECHNIQUE: Contiguous axial images were obtained through the lumbar spine after
the intrathecal infusion of contrast. Coronal and sagittal
reconstructions were obtained of the axial image sets.

[Series 1: w lumbar spine lat · 0.15mm/px · 1 of 1 slices shown]
[im 1/1]
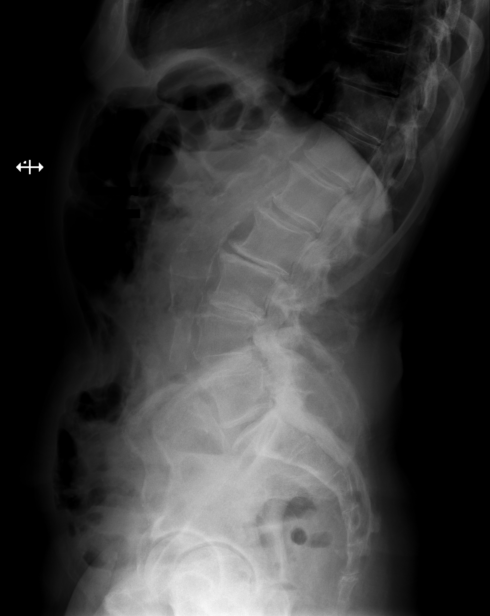

[Series 1: vasc standard · 1 of 1 slices shown (1 of 11)]
[im 1/1]
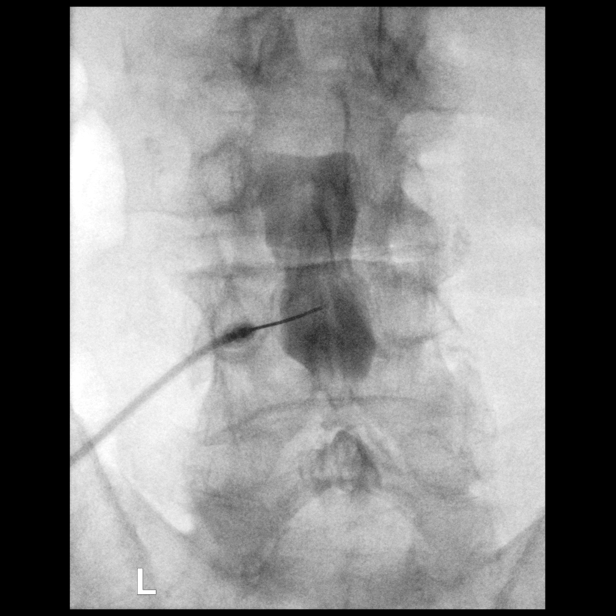

[Series 2: vasc standard · 1 of 1 slices shown (2 of 11)]
[im 1/1]
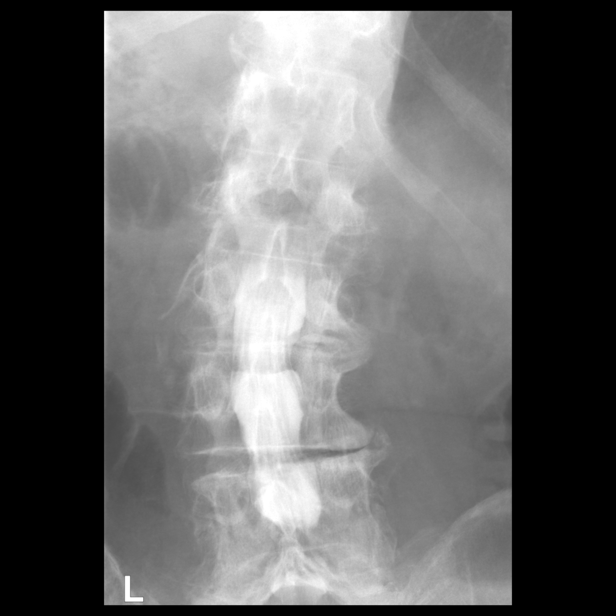

[Series 4: vasc standard · 1 of 1 slices shown (3 of 11)]
[im 1/1]
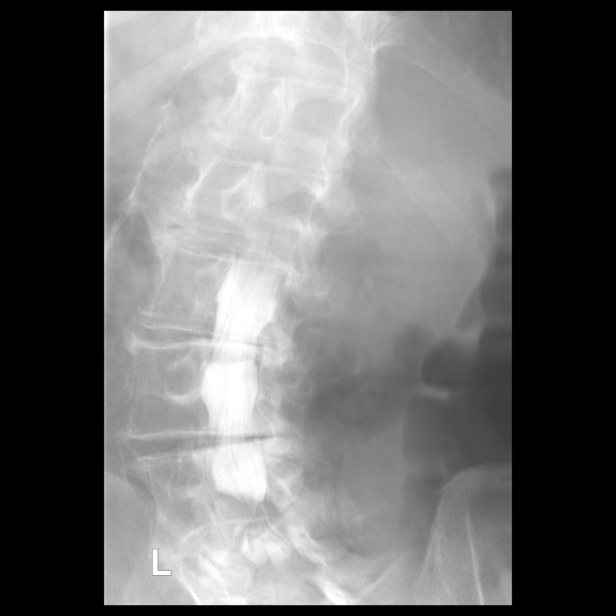

[Series 5: vasc standard · 1 of 1 slices shown (4 of 11)]
[im 1/1]
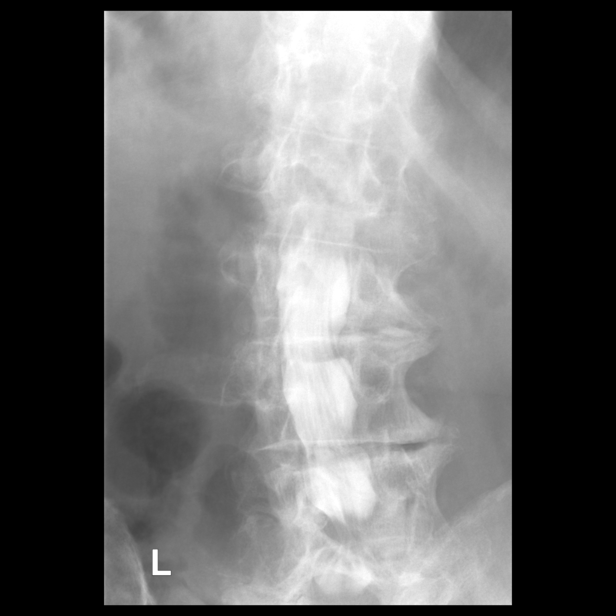

[Series 6: vasc standard · 1 of 1 slices shown (5 of 11)]
[im 1/1]
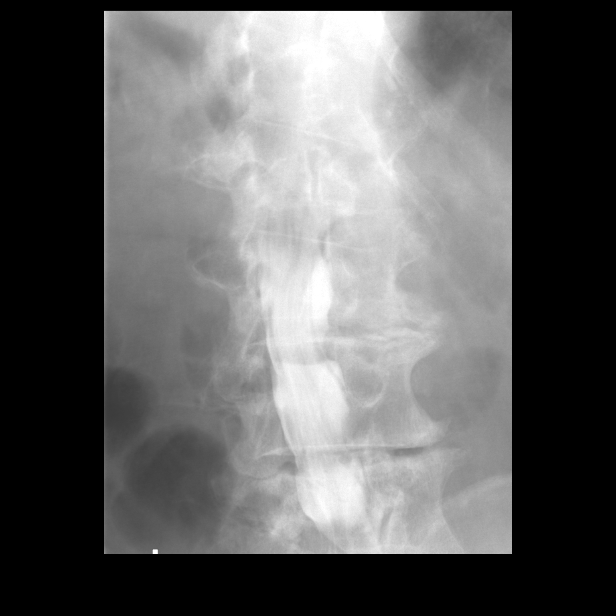

[Series 7: vasc standard · 1 of 1 slices shown (6 of 11)]
[im 1/1]
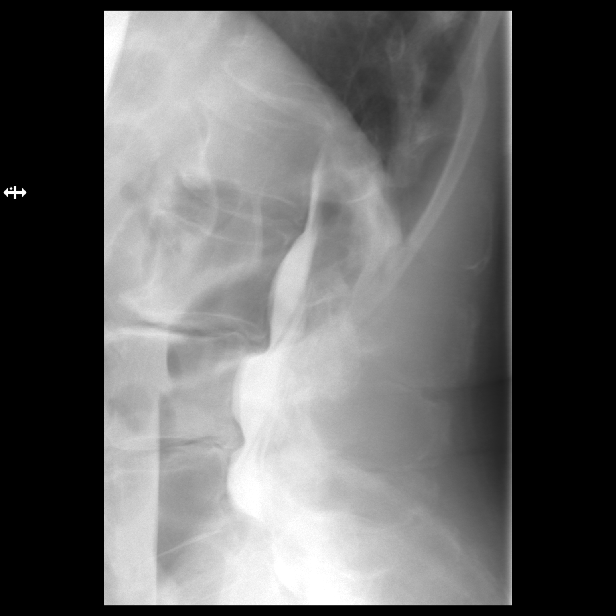

[Series 8: vasc standard · 1 of 1 slices shown (7 of 11)]
[im 1/1]
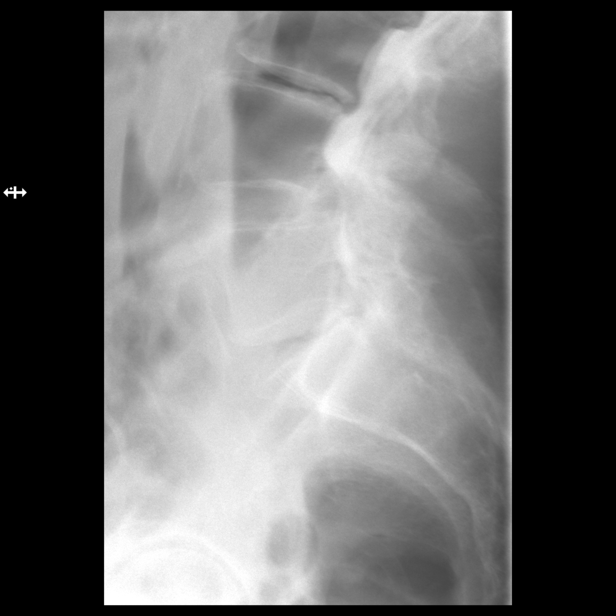

[Series 9: vasc standard · 1 of 1 slices shown (8 of 11)]
[im 1/1]
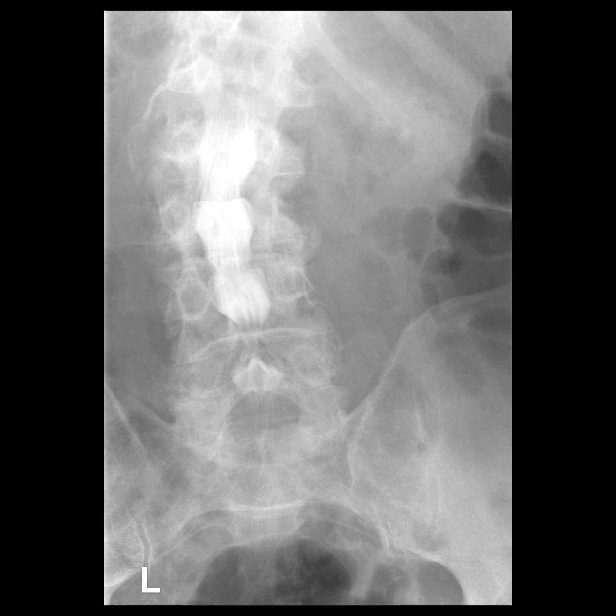

[Series 11: vasc standard · 1 of 1 slices shown (9 of 11)]
[im 1/1]
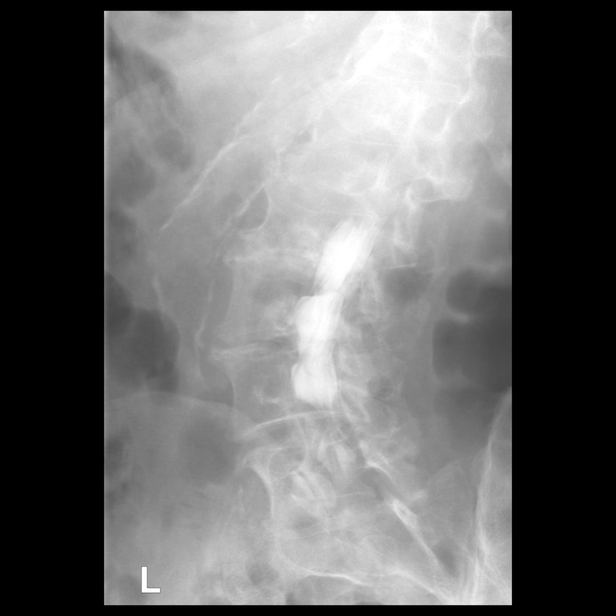

[Series 12: vasc standard · 1 of 1 slices shown (10 of 11)]
[im 1/1]
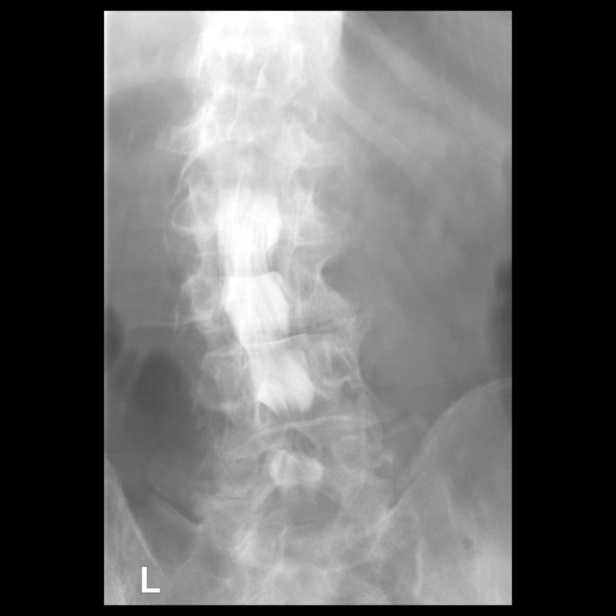

[Series 13: vasc standard · 1 of 1 slices shown (11 of 11)]
[im 1/1]
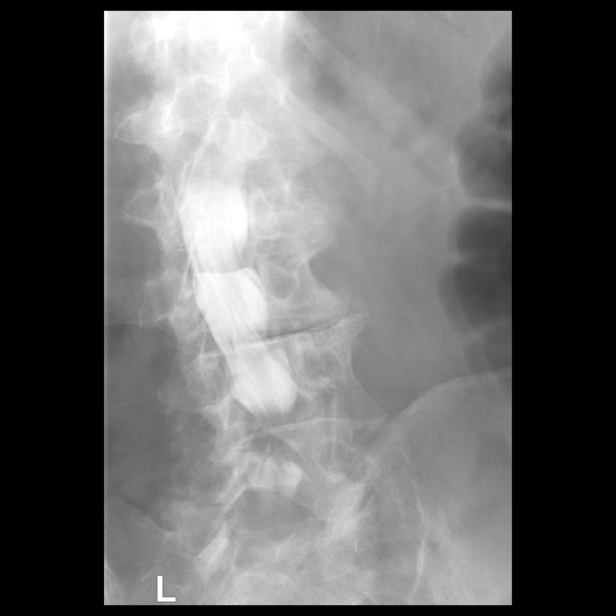

[12 of 14 positions shown; findings below may reference images not displayed]

EXAM:
LUMBAR MYELOGRAM

CT LUMBAR MYELOGRAM

FLUOROSCOPY TIME:  Radiation Exposure Index (as provided by the
fluoroscopic device): 10.3 mGy

Fluoroscopy Time:  10 seconds

Number of Acquired Images:  13

PROCEDURE:
After thorough discussion of risks and benefits of the procedure
including bleeding, infection, injury to nerves, blood vessels,
adjacent structures as well as headache and CSF leak, written and
oral informed consent was obtained. Consent was obtained by Dr.
Pandora Matteo. Time out form was completed.

Patient was positioned prone on the fluoroscopy table. Local
anesthesia was provided with 1% lidocaine without epinephrine after
prepped and draped in the usual sterile fashion. Puncture was
performed at L3-L4 using a 3 1/2 inch 22-gauge spinal needle via
left interlaminar approach. Using a single pass through the dura,
the needle was placed within the thecal sac, with return of clear
CSF. 15 mL of Isovue S-NXX was injected into the thecal sac, with
normal opacification of the nerve roots and cauda equina consistent
with free flow within the subarachnoid space.

I personally performed the lumbar puncture and administered the
intrathecal contrast. I also personally supervised acquisition of
the myelogram images.
FINDINGS: LUMBAR MYELOGRAM FINDINGS:

Due to weakness, the patient was only able to complete a lateral
standing view in neutral position. Flexion and extension views were
not obtained.

Levoscoliosis, apex at L2-L3. Trace retrolisthesis at T12-L1, L1-L2,
and L3-L4. 4 mm retrolisthesis at L2-L3. 4 mm anterolisthesis at
L4-L5. Small ventral extradural defects from T12-L1 through L3-L4.
Large ventral extradural defect at L4-L5 with severe spinal canal
stenosis. Underfilling of the bilateral L5 nerve roots.

CT LUMBAR MYELOGRAM FINDINGS:

Segmentation: Standard.

Alignment: Levoscoliosis. Trace retrolisthesis at T12-L1, L1-L2, and
L3-L4. 4 mm retrolisthesis at L2-L3. 4 mm anterolisthesis at L4-L5.

Vertebrae: No acute fracture or other focal pathologic process.

Conus medullaris and cauda equina: Conus extends to the L1-L2 level.
Conus and cauda equina appear normal.

Paraspinal and other soft tissues: 4 mm nonobstructing calculus in
the lower pole of the right kidney. 1.8 cm simple cyst in the lower
pole of the right kidney. Aortoiliac atherosclerotic vascular
disease. Ectatic infrarenal abdominal aorta measuring up to 2.7 cm.

Disc levels:

T10-T11: Moderate disc height loss without significant disc bulge or
herniation. No stenosis.

T11-T12: Mild disc height loss without significant disc bulge or
herniation. No stenosis.

T12-L1:  Mild diffuse disc bulging.  No stenosis.

L1-L2: Mild diffuse disc bulging. Mild bilateral facet arthropathy.
No stenosis.

L2-L3: Asymmetric severe right-sided disc height loss and endplate
spurring with mild diffuse disc bulging. Mild bilateral facet
arthropathy. Mild to moderate right neuroforaminal stenosis. No
spinal canal or left neuroforaminal stenosis.

L3-L4: Asymmetric severe right-sided disc height loss and endplate
spurring with mild diffuse disc bulging. Mild bilateral facet
arthropathy. Mild spinal canal stenosis. Mild bilateral
neuroforaminal stenosis.

L4-L5: Moderate diffuse disc bulging with superimposed central disc
extrusion migrating superiorly. Severe bilateral facet arthropathy.
Severe spinal canal and lateral recess stenosis. Severe left and
mild to moderate right neuroforaminal stenosis.

L5-S1: Mild disc bulging. Moderate left and mild right facet
arthropathy. Moderate left neuroforaminal stenosis. No spinal canal
or right neuroforaminal stenosis.
IMPRESSION: 1. Advanced multilevel lumbar spondylosis as described above. Severe
stenosis at L4-L5.
2. Nonobstructive right nephrolithiasis.
3. Ectatic abdominal aorta at risk for aneurysm development.
Recommend followup by ultrasound in 5 years. This recommendation
follows ACR consensus guidelines: White Paper of the ACR Incidental
Findings Committee II on Vascular Findings. [HOSPITAL] 5784;
Aortic aneurysm NOS ([facility]CAU-IUN.W)
4.  Aortic atherosclerosis (FVCAU-AM8.8).

## 2021-02-20 IMAGING — CT CT L SPINE W/ CM
1 of 6 series · 6 of 14 positions shown, 8 images · non-contrast
Comparison: None.

CLINICAL DATA: Bilateral posterior thigh pain and leg weakness for
the past several weeks. Denies back pain. No prior surgery.
TECHNIQUE: Contiguous axial images were obtained through the lumbar spine after
the intrathecal infusion of contrast. Coronal and sagittal
reconstructions were obtained of the axial image sets.

[Series 3: l spine soft · axial · 0.35mm/px · z∈[-268,-80]mm · 6 of 89 slices shown, 8 images]
[im 13/89  soft-tissue]
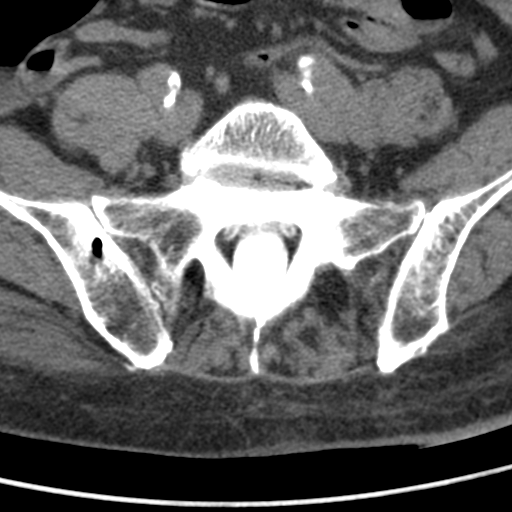
[im 13/89  bone]
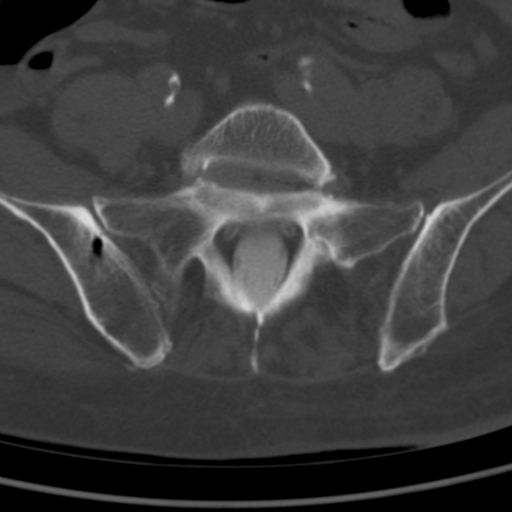
[im 26/89  bone]
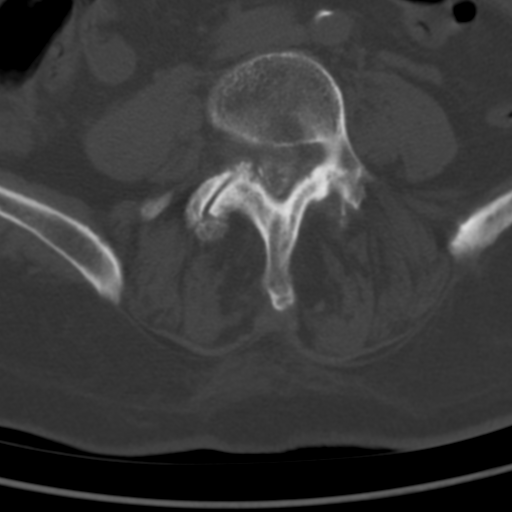
[im 38/89  bone]
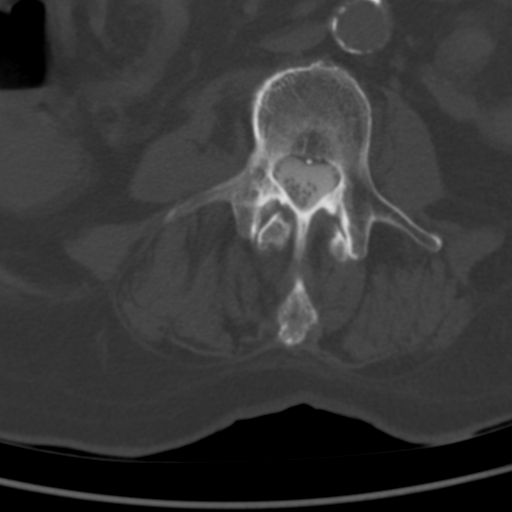
[im 51/89  bone]
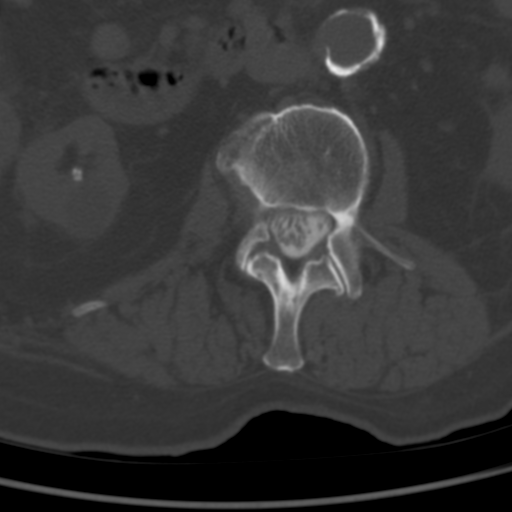
[im 63/89  soft-tissue]
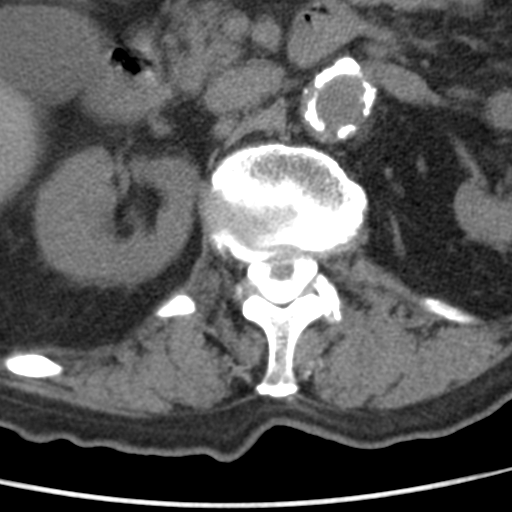
[im 63/89  bone]
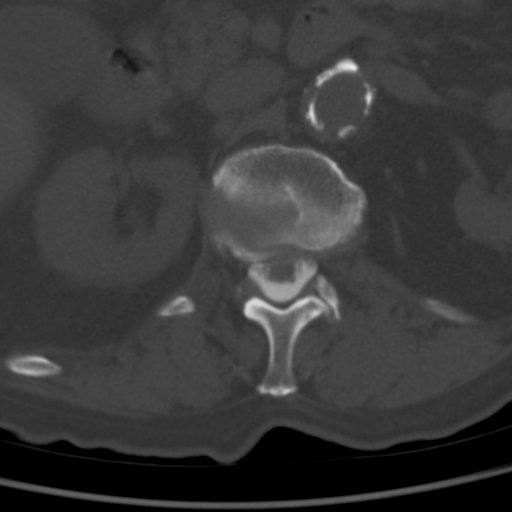
[im 76/89  bone]
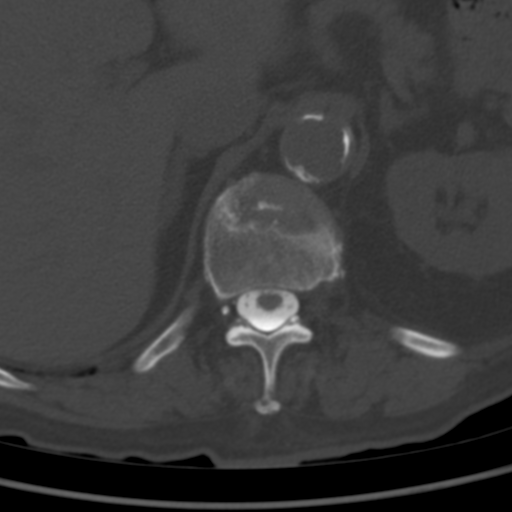

[6 of 14 positions shown; findings below may reference images not displayed]

EXAM:
LUMBAR MYELOGRAM

CT LUMBAR MYELOGRAM

FLUOROSCOPY TIME:  Radiation Exposure Index (as provided by the
fluoroscopic device): 10.3 mGy

Fluoroscopy Time:  10 seconds

Number of Acquired Images:  13

PROCEDURE:
After thorough discussion of risks and benefits of the procedure
including bleeding, infection, injury to nerves, blood vessels,
adjacent structures as well as headache and CSF leak, written and
oral informed consent was obtained. Consent was obtained by Dr.
Pandora Matteo. Time out form was completed.

Patient was positioned prone on the fluoroscopy table. Local
anesthesia was provided with 1% lidocaine without epinephrine after
prepped and draped in the usual sterile fashion. Puncture was
performed at L3-L4 using a 3 1/2 inch 22-gauge spinal needle via
left interlaminar approach. Using a single pass through the dura,
the needle was placed within the thecal sac, with return of clear
CSF. 15 mL of Isovue S-NXX was injected into the thecal sac, with
normal opacification of the nerve roots and cauda equina consistent
with free flow within the subarachnoid space.

I personally performed the lumbar puncture and administered the
intrathecal contrast. I also personally supervised acquisition of
the myelogram images.
FINDINGS: LUMBAR MYELOGRAM FINDINGS:

Due to weakness, the patient was only able to complete a lateral
standing view in neutral position. Flexion and extension views were
not obtained.

Levoscoliosis, apex at L2-L3. Trace retrolisthesis at T12-L1, L1-L2,
and L3-L4. 4 mm retrolisthesis at L2-L3. 4 mm anterolisthesis at
L4-L5. Small ventral extradural defects from T12-L1 through L3-L4.
Large ventral extradural defect at L4-L5 with severe spinal canal
stenosis. Underfilling of the bilateral L5 nerve roots.

CT LUMBAR MYELOGRAM FINDINGS:

Segmentation: Standard.

Alignment: Levoscoliosis. Trace retrolisthesis at T12-L1, L1-L2, and
L3-L4. 4 mm retrolisthesis at L2-L3. 4 mm anterolisthesis at L4-L5.

Vertebrae: No acute fracture or other focal pathologic process.

Conus medullaris and cauda equina: Conus extends to the L1-L2 level.
Conus and cauda equina appear normal.

Paraspinal and other soft tissues: 4 mm nonobstructing calculus in
the lower pole of the right kidney. 1.8 cm simple cyst in the lower
pole of the right kidney. Aortoiliac atherosclerotic vascular
disease. Ectatic infrarenal abdominal aorta measuring up to 2.7 cm.

Disc levels:

T10-T11: Moderate disc height loss without significant disc bulge or
herniation. No stenosis.

T11-T12: Mild disc height loss without significant disc bulge or
herniation. No stenosis.

T12-L1:  Mild diffuse disc bulging.  No stenosis.

L1-L2: Mild diffuse disc bulging. Mild bilateral facet arthropathy.
No stenosis.

L2-L3: Asymmetric severe right-sided disc height loss and endplate
spurring with mild diffuse disc bulging. Mild bilateral facet
arthropathy. Mild to moderate right neuroforaminal stenosis. No
spinal canal or left neuroforaminal stenosis.

L3-L4: Asymmetric severe right-sided disc height loss and endplate
spurring with mild diffuse disc bulging. Mild bilateral facet
arthropathy. Mild spinal canal stenosis. Mild bilateral
neuroforaminal stenosis.

L4-L5: Moderate diffuse disc bulging with superimposed central disc
extrusion migrating superiorly. Severe bilateral facet arthropathy.
Severe spinal canal and lateral recess stenosis. Severe left and
mild to moderate right neuroforaminal stenosis.

L5-S1: Mild disc bulging. Moderate left and mild right facet
arthropathy. Moderate left neuroforaminal stenosis. No spinal canal
or right neuroforaminal stenosis.
IMPRESSION: 1. Advanced multilevel lumbar spondylosis as described above. Severe
stenosis at L4-L5.
2. Nonobstructive right nephrolithiasis.
3. Ectatic abdominal aorta at risk for aneurysm development.
Recommend followup by ultrasound in 5 years. This recommendation
follows ACR consensus guidelines: White Paper of the ACR Incidental
Findings Committee II on Vascular Findings. [HOSPITAL] 5784;
Aortic aneurysm NOS (FVCAU-IUN.W)
4.  Aortic atherosclerosis (FVCAU-AM8.8).

## 2021-04-04 IMAGING — DX DG SPINE 1V PORT
1 series · 1 of 1 positions shown · non-contrast
Comparison: Radiograph 06/29/2019

CLINICAL DATA: Intraoperative placement. Please number vertebra.

EXAM:
PORTABLE SPINE - 1 VIEW

[l-spine lat]
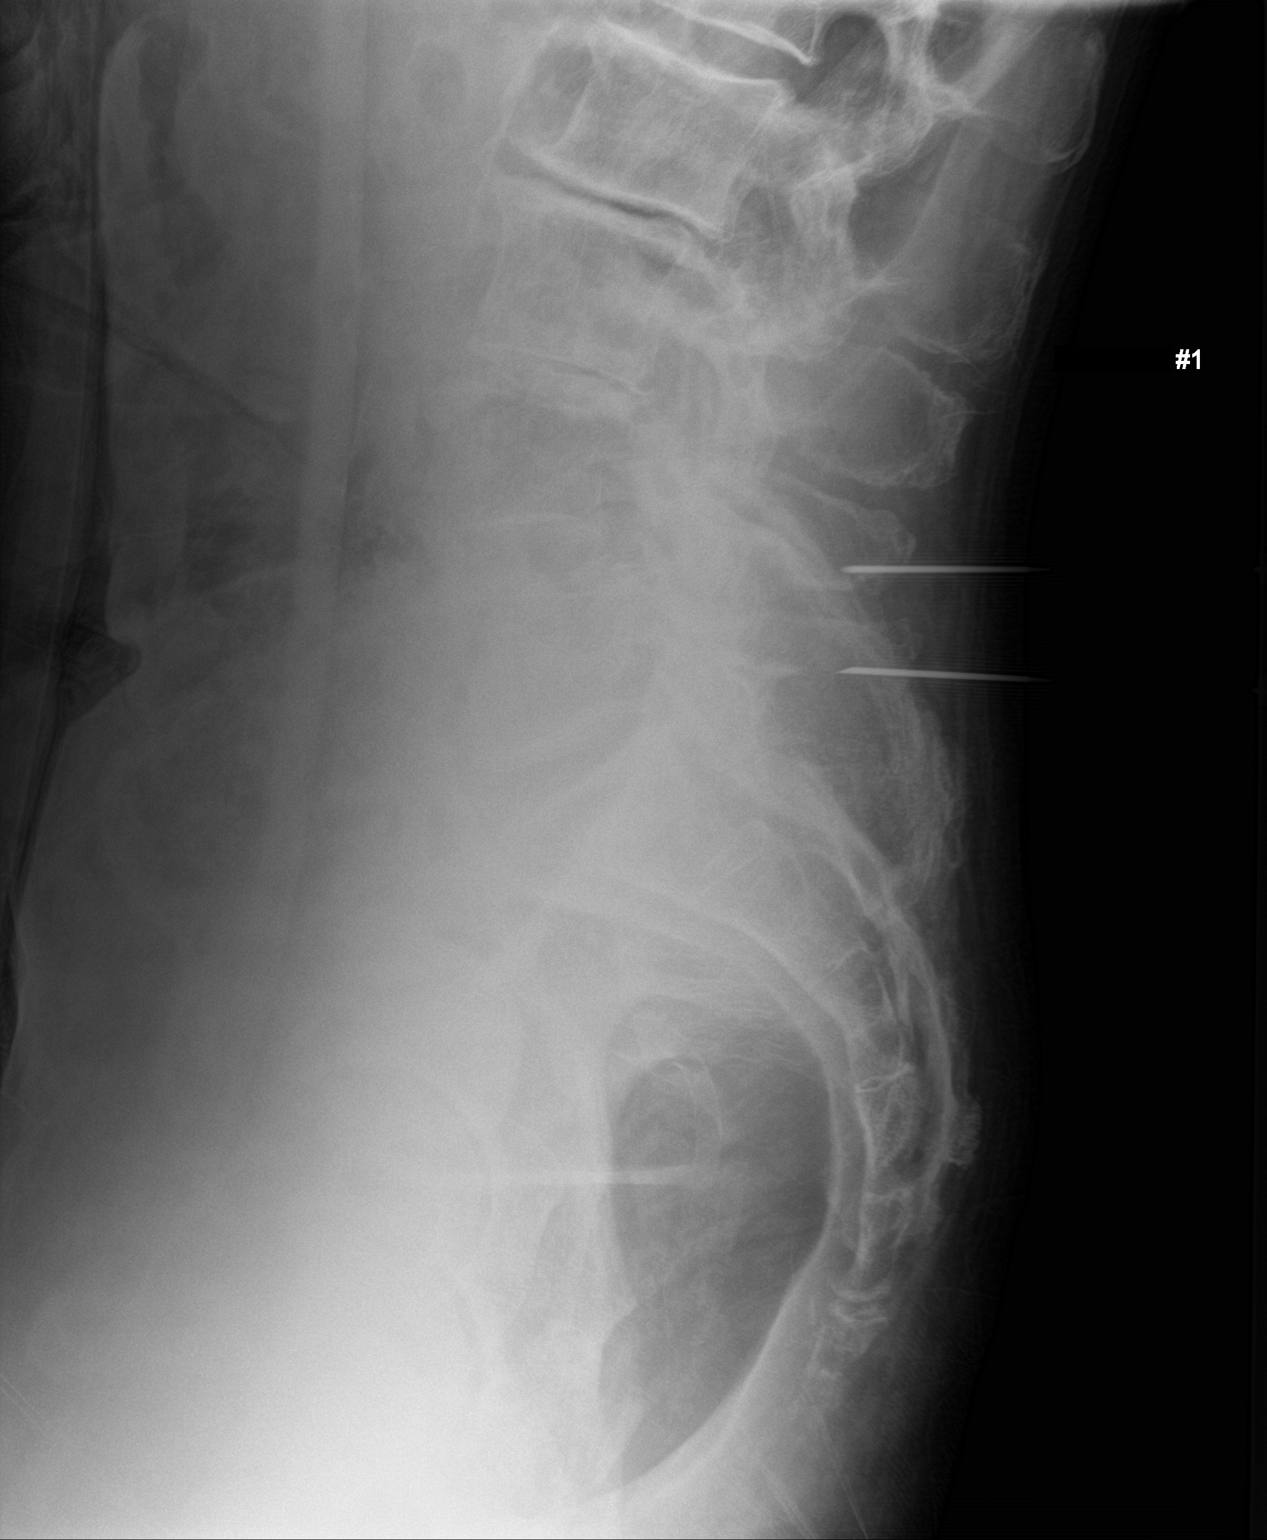

[1 of 1 positions shown; findings below may reference images not displayed]

FINDINGS: Single lateral view of the lumbar spine provided. Surgical
instruments localize posterior to L4-L5 and L5-S1. Spinal numbering
labeled, same numbering system as on prior exam.
IMPRESSION: Surgical instruments localize posterior to L4-L5 and L5-S1.
